# Patient Record
Sex: Female | Born: 1960 | Race: White | Hispanic: No | Smoking: Former smoker
Health system: Southern US, Community
[De-identification: ages and names within clinical notes are randomized; demographics above are authoritative.]

## PROBLEM LIST (undated history)

## (undated) DIAGNOSIS — K219 Gastro-esophageal reflux disease without esophagitis: Secondary | ICD-10-CM

## (undated) DIAGNOSIS — F32A Depression, unspecified: Secondary | ICD-10-CM

## (undated) HISTORY — PX: CHOLECYSTECTOMY: SHX55

---

## 1998-03-05 ENCOUNTER — Other Ambulatory Visit: Admission: RE | Admit: 1998-03-05 | Discharge: 1998-03-05 | Payer: Self-pay | Admitting: Obstetrics and Gynecology

## 1999-01-31 ENCOUNTER — Ambulatory Visit (HOSPITAL_COMMUNITY): Admission: AD | Admit: 1999-01-31 | Discharge: 1999-01-31 | Payer: Self-pay | Admitting: Obstetrics and Gynecology

## 1999-01-31 ENCOUNTER — Encounter (INDEPENDENT_AMBULATORY_CARE_PROVIDER_SITE_OTHER): Payer: Self-pay | Admitting: *Deleted

## 1999-01-31 ENCOUNTER — Other Ambulatory Visit: Admission: RE | Admit: 1999-01-31 | Discharge: 1999-01-31 | Payer: Self-pay | Admitting: Obstetrics and Gynecology

## 2000-03-18 ENCOUNTER — Encounter: Admission: RE | Admit: 2000-03-18 | Discharge: 2000-03-18 | Payer: Self-pay | Admitting: Obstetrics and Gynecology

## 2000-03-18 ENCOUNTER — Encounter: Payer: Self-pay | Admitting: Obstetrics and Gynecology

## 2001-05-11 ENCOUNTER — Other Ambulatory Visit: Admission: RE | Admit: 2001-05-11 | Discharge: 2001-05-11 | Payer: Self-pay | Admitting: *Deleted

## 2002-09-22 ENCOUNTER — Emergency Department (HOSPITAL_COMMUNITY): Admission: EM | Admit: 2002-09-22 | Discharge: 2002-09-22 | Payer: Self-pay | Admitting: Emergency Medicine

## 2003-12-27 ENCOUNTER — Encounter: Admission: RE | Admit: 2003-12-27 | Discharge: 2003-12-27 | Payer: Self-pay | Admitting: Family Medicine

## 2004-11-18 ENCOUNTER — Emergency Department (HOSPITAL_COMMUNITY): Admission: EM | Admit: 2004-11-18 | Discharge: 2004-11-18 | Payer: Self-pay | Admitting: Emergency Medicine

## 2006-01-30 ENCOUNTER — Other Ambulatory Visit: Admission: RE | Admit: 2006-01-30 | Discharge: 2006-01-30 | Payer: Self-pay | Admitting: Family Medicine

## 2006-02-02 ENCOUNTER — Encounter: Admission: RE | Admit: 2006-02-02 | Discharge: 2006-02-02 | Payer: Self-pay | Admitting: Family Medicine

## 2006-10-14 ENCOUNTER — Encounter: Admission: RE | Admit: 2006-10-14 | Discharge: 2006-10-14 | Payer: Self-pay | Admitting: Gastroenterology

## 2007-02-04 ENCOUNTER — Other Ambulatory Visit: Admission: RE | Admit: 2007-02-04 | Discharge: 2007-02-04 | Payer: Self-pay | Admitting: Family Medicine

## 2007-08-07 ENCOUNTER — Emergency Department (HOSPITAL_COMMUNITY): Admission: EM | Admit: 2007-08-07 | Discharge: 2007-08-07 | Payer: Self-pay | Admitting: Emergency Medicine

## 2007-10-09 ENCOUNTER — Emergency Department (HOSPITAL_COMMUNITY): Admission: EM | Admit: 2007-10-09 | Discharge: 2007-10-09 | Payer: Self-pay | Admitting: Emergency Medicine

## 2007-10-14 ENCOUNTER — Encounter: Admission: RE | Admit: 2007-10-14 | Discharge: 2007-10-14 | Payer: Self-pay | Admitting: Gastroenterology

## 2007-10-15 ENCOUNTER — Inpatient Hospital Stay (HOSPITAL_COMMUNITY): Admission: AD | Admit: 2007-10-15 | Discharge: 2007-10-20 | Payer: Self-pay | Admitting: Gastroenterology

## 2007-10-27 ENCOUNTER — Encounter: Admission: RE | Admit: 2007-10-27 | Discharge: 2007-10-27 | Payer: Self-pay | Admitting: Diagnostic Radiology

## 2008-06-19 IMAGING — CR DG ABDOMEN ACUTE W/ 1V CHEST
3 series · 3 of 3 positions shown · non-contrast
Comparison: None

CLINICAL DATA: Nausea vomiting and diarrhea low abdominal pain

ACUTE ABDOMEN SERIES (ABDOMEN 2 VIEW & CHEST 1 VIEW)

[w chest pa *]
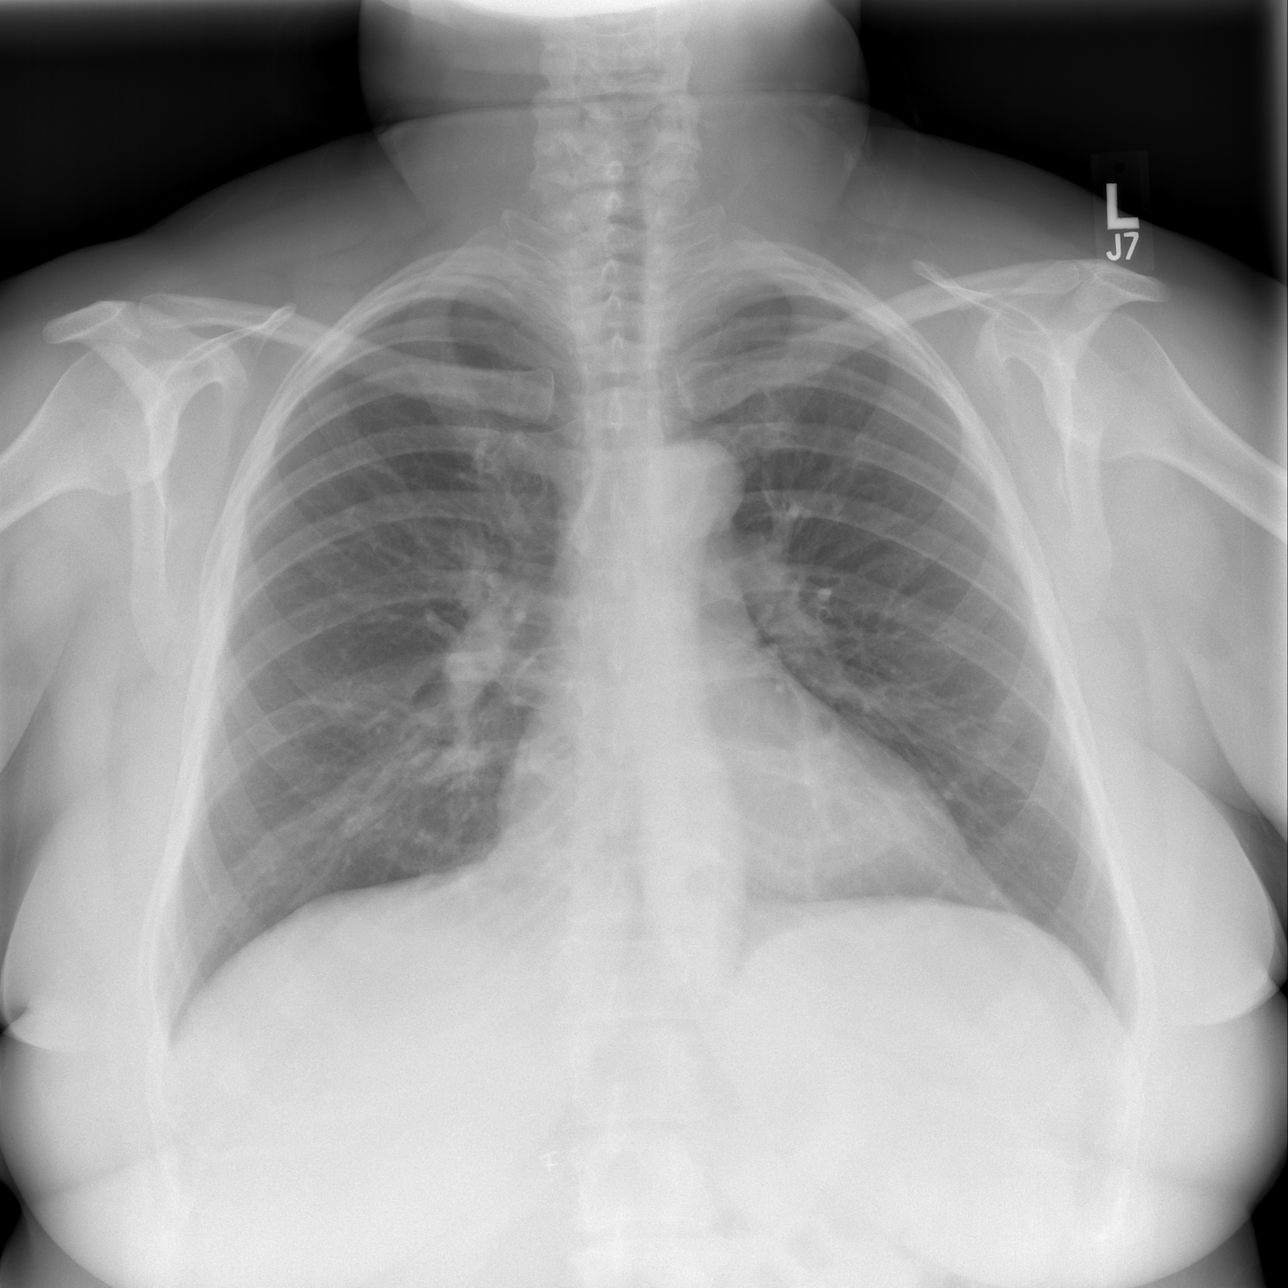

[w abdomen upright]
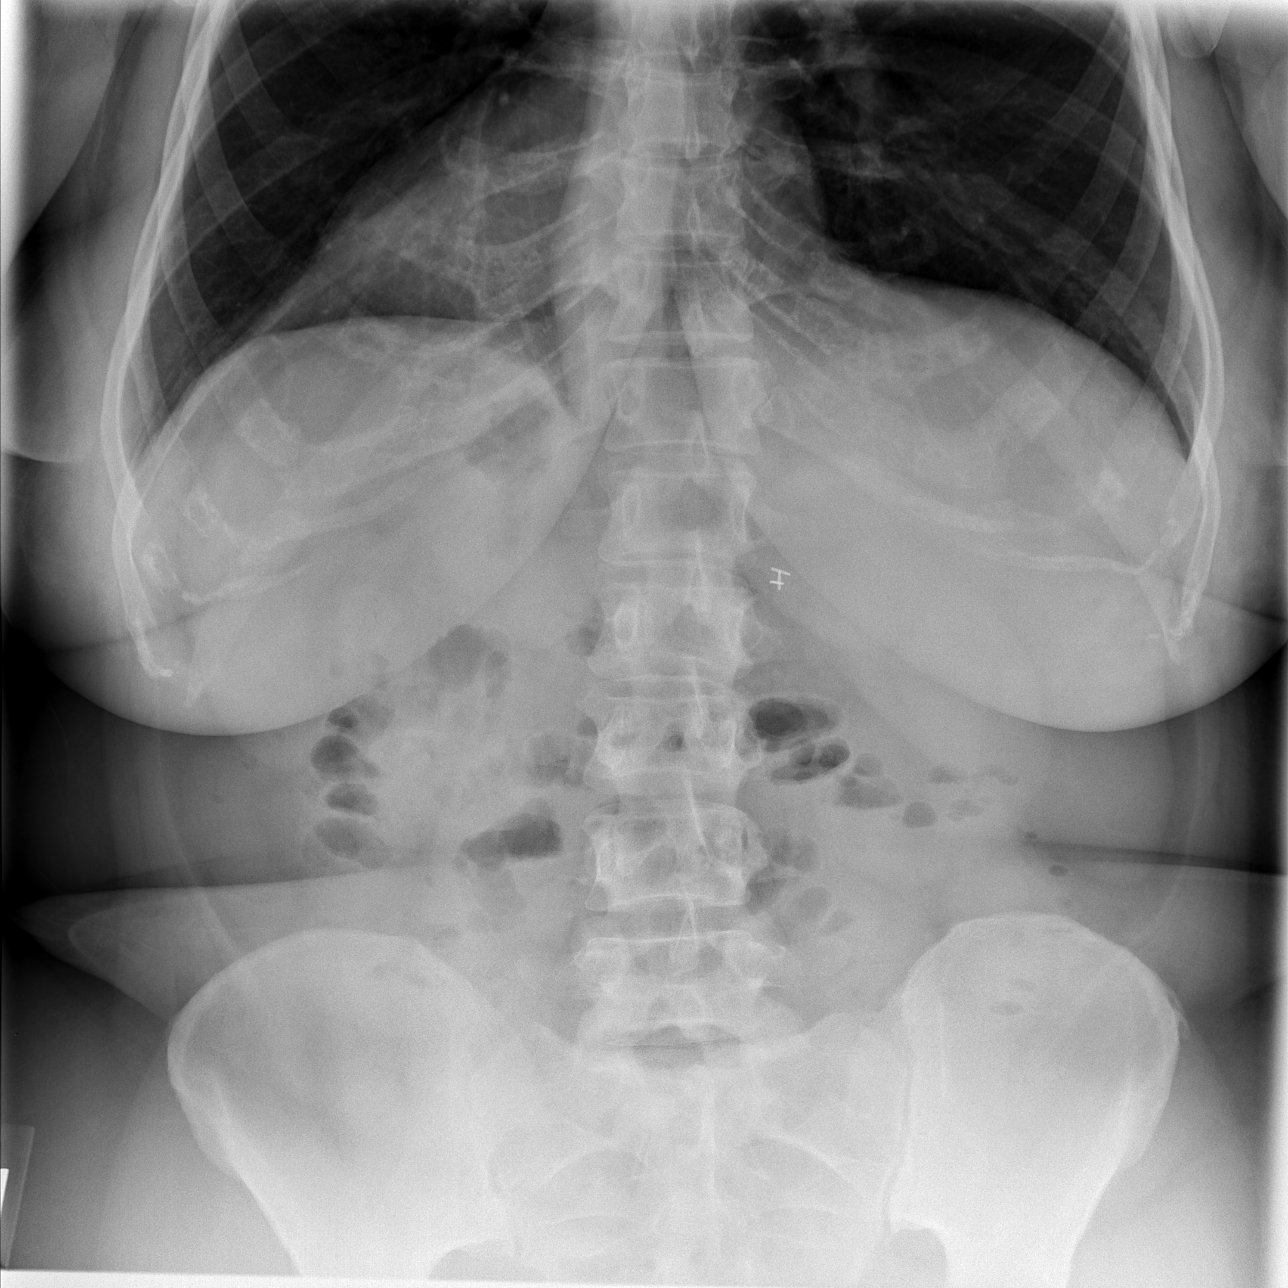

[t abdomen supine]
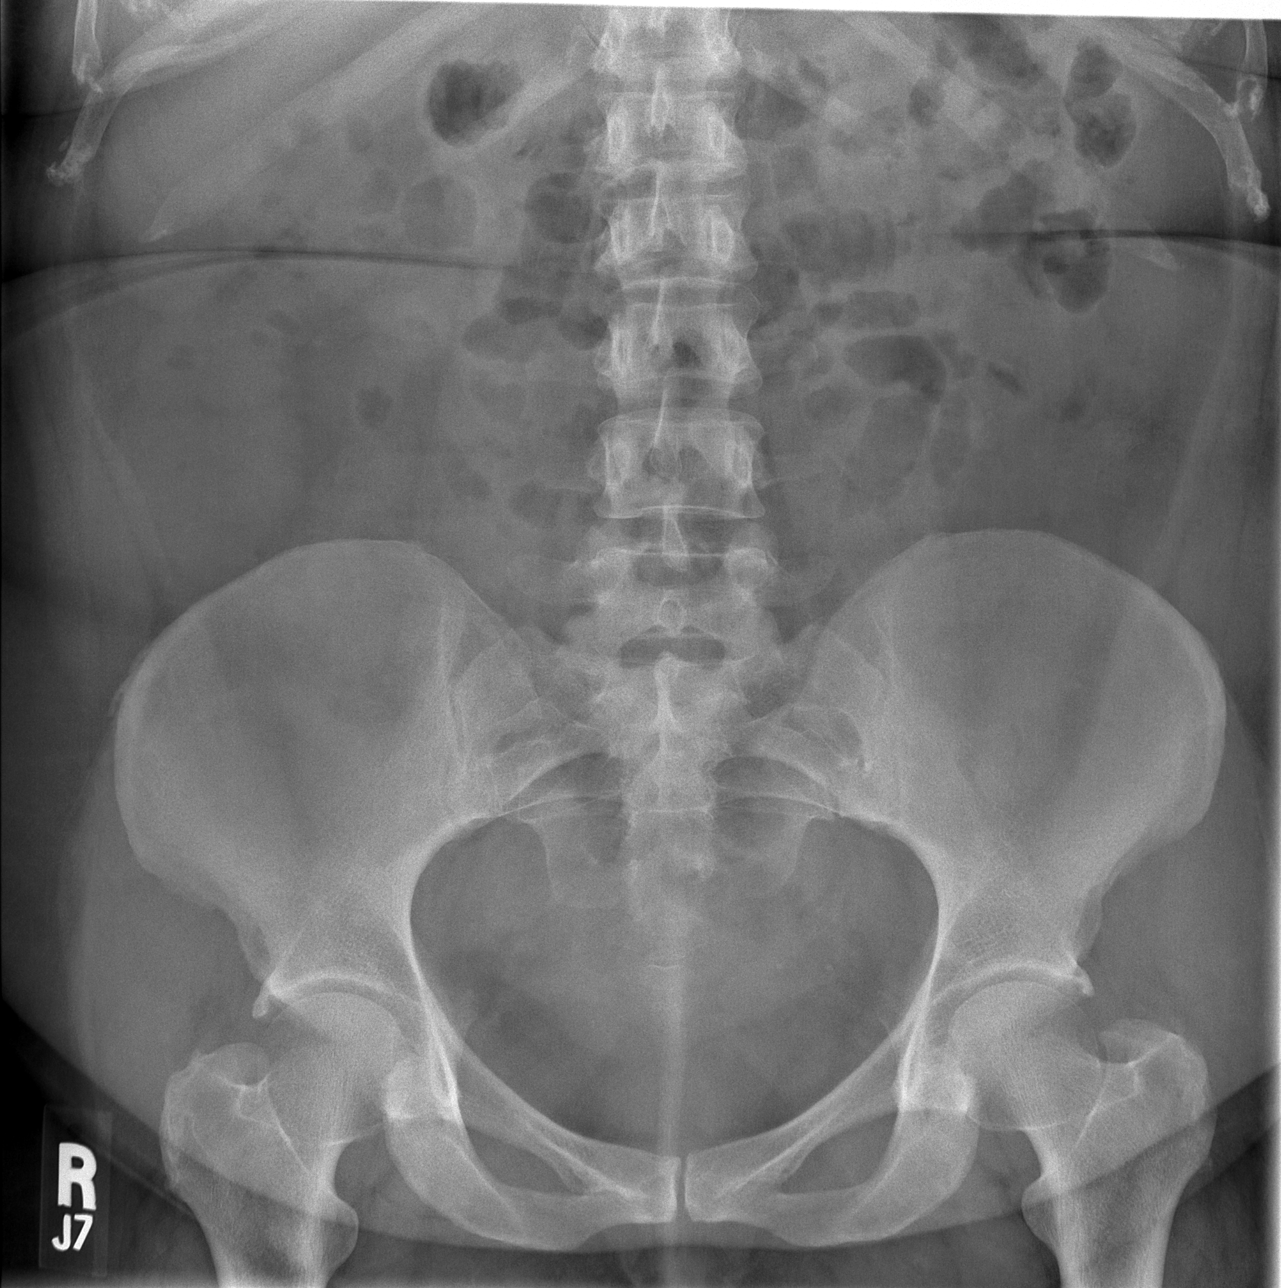

[3 of 3 positions shown; findings below may reference images not displayed]

FINDINGS: The lungs are clear without focal consolidation, edema,
effusion or pneumothorax.  Cardiopericardial silhouette is within
normal limits for size.  Imaged bony structures of the thorax are
intact.

Upright film shows no intraperitoneal free air.  The bowel gas
pattern is nonspecific.  Surgical clips in the right upper quadrant
suggest prior cholecystectomy.  Visualized bony structures are
unremarkable.
IMPRESSION: No acute cardiopulmonary process.

Nonspecific bowel gas pattern.

## 2009-04-13 ENCOUNTER — Other Ambulatory Visit: Admission: RE | Admit: 2009-04-13 | Discharge: 2009-04-13 | Payer: Self-pay | Admitting: Family Medicine

## 2010-08-12 ENCOUNTER — Other Ambulatory Visit (HOSPITAL_COMMUNITY)
Admission: RE | Admit: 2010-08-12 | Discharge: 2010-08-12 | Disposition: A | Discharge status: Home / Self Care | Payer: BC Managed Care – PPO | Source: Ambulatory Visit | Attending: Family Medicine | Admitting: Family Medicine

## 2010-08-12 ENCOUNTER — Other Ambulatory Visit: Payer: Self-pay | Admitting: Family Medicine

## 2010-08-12 DIAGNOSIS — Z124 Encounter for screening for malignant neoplasm of cervix: Secondary | ICD-10-CM | POA: Insufficient documentation

## 2010-09-10 NOTE — Discharge Summary (Signed)
NAME:  Margaret Hansen, Margaret Hansen               ACCOUNT NO.:  [id number]   MEDICAL RECORD NO.:  [id number]          PATIENT TYPE:  INP   LOCATION:  5121                         FACILITY:  [facility]   PHYSICIAN:  John C. Madilyn Hansen, M.D.    DATE OF BIRTH:  1960-06-18   DATE OF ADMISSION:  10/15/2007  DATE OF DISCHARGE:  10/20/2007                               DISCHARGE SUMMARY   DISCHARGE DIAGNOSES:  Complicated rectosigmoid diverticulitis with  diverticular abscess.   PAST MEDICAL HISTORY:  Significant for obesity and depression.   CONSULTS:  [facility] on October 18, 2007, Dr. Gabrielle Dare.  [name], MD.   PROCEDURES:  Interventional radiology who performed a CT-guided left  lower quadrant diverticular abscess drainage and drain placement on October 18, 2007.  This was done by Dr. Ruel Favors.   HISTORY:  This is a 50 year old female with a history of probable  diverticulitis in 2004.  She was admitted after worsening of sigmoid  diverticulitis was noticed on CT scan done October 14, 2007.  This is after  she had taken 6 days of oral Cipro and Flagyl.  Her CT scan demonstrated  a new abscess that was approximately 3 x 7 cm.  The patient looked well.  She denies severe pain.  She reported fevers of 100-101 degrees.  She  did not want to come into the hospital, but was convinced that she  should be admitted for IV antibiotics and further workup.   HOSPITAL COURSE:  She was admitted to Dr. Beverlee Nims service and  treated with IV cefoxitin as well as IV Flagyl.  Her CT was repeated on  October 18, 2007, she had an increase in the size of her abscess as well as  several new smaller fluid collections identified posterior to the  uterus, in the area of her colon.  Her diverticulitis was not improved.  [facility] was consulted.  They recommended interventional  radiology be called and the abscess be drained.  They also recommended  an elective hemicolectomy in 6-8 weeks after her  diverticulitis cools  down.  On October 19, 2007, the interventional radiology procedure was done  without complications.  Dr. Ruel Favors was able to drain the abscess  and then insert a 10-French drain.   PHYSICAL EXAMINATION:  The patient was alert and oriented.  She looked  well.  She was up and walking about the room.  She denied any pain.  Her  drain was placed.  The site looked clean and dry, was bandaged without  any leakage.  She had approximately 10 mL of pink fluid in her drain.  She was not tender.  She denied any further fevers or headaches.   LABORATORY DATA:  White count was 10.7, hemoglobin 11.5, hematocrit  34.8, and platelets 352.  BMET was within normal limits.  Culture  abscess, preliminary returned today and reports gram-positive cocci in  pairs, moderate wbc present, both PMN and mononuclear.   The patient was discharged to home in good condition.  Home Health was  ordered for her to help with drain care  and daily flushes.  She has  followup appointments with [facility] in 2-3 weeks.  She  has an appointment with Dr. Herbert Moors on November 12, 2007.   DISCHARGE MEDICATIONS:  1. Flagyl 500 mg p.o. b.i.d.  2. Augmentin 875 mg p.o. b.i.d.  3. Prozac 40 mg daily.  4. MiraLax 17 g p.o. daily.   She was also given prescriptions for tramadol 50 mg 1 tablet p.o. every  6 hours p.r.n. pain and Phenergan 25 mg 1 tablet p.o. every 12 hours  p.r.n. pain.   She was instructed to call the [facility] GI office with any fevers, increase  in pain, nausea, or vomiting.  She was also given a note to return to  work on Monday, October 25, 2007, barring any fever, increased pain, or  vomiting.      Stephani Police, PA    ______________________________  Margaret Hansen, M.D.    MLY/MEDQ  D:  10/20/2007  T:  10/21/2007  Job:  [id number]   cc:   Graylin Shiver, M.D.  Quita Skye Guderian Flock, M.D.

## 2010-09-10 NOTE — H&P (Signed)
NAME:  Margaret Hansen, Margaret Hansen NO.:  [id number]   MEDICAL RECORD NO.:  [id number]          PATIENT TYPE:  INP   LOCATION:  5121                         FACILITY:  [facility]   PHYSICIAN:  Graylin Shiver, M.D.   DATE OF BIRTH:  1960-08-20   DATE OF ADMISSION:  10/15/2007  DATE OF DISCHARGE:                              HISTORY & PHYSICAL   CHIEF COMPLAINT:  Abdominal pain, diverticulitis.   HISTORY OF PRESENT ILLNESS:  The patient is a 50 year old white female  who went to the emergency room 6 days ago with abdominal pain.  A CT  scan showed findings consistent with a mild sigmoid diverticulitis.  She  was sent home on Cipro t.i.d. and Flagyl t.i.d.  Two days later, she saw  Dr. Dorena Cookey in the office and a followup CT scan was done yesterday,  which showed worsening of the diverticulitis and the development of a  abscess.  It was felt that the patient needed admission for IV  antibiotics.  When we called the patient today, she was at work.  She  did not really want to come into the hospital, but we told her that she  should be here for this.  She still has some lower abdominal pain, but  it is not that bad.  She has been running of fever at home anywhere from  100-101 at times.  She has continued to take the antibiotics, but  because of the development of the abscess, it was felt important that  she be in the hospital for IV antibiotics and closer followup   ALLERGIES:  None known.   MEDICATIONS:  Prozac 40 mg daily.   MEDICAL PROBLEMS:  Depression.   SURGERIES:  Cholecystectomy, two C-sections.   SOCIAL HISTORY:  Does not smoke, rarely drinks alcohol.   REVIEW OF SYSTEMS:  No complaints of anginal chest pain, shortness of  breath, cough, or sputum production.   PHYSICAL EXAMINATION:  VITAL SIGNS:  Temperature 98.2, blood pressure  103/65, and pulse 79.  GENERAL:  She is in no distress, alert, oriented, and nonicteric.  NECK:  Supple.  HEART:  Regular rhythm.  No  murmurs.  LUNGS:  Clear.  ABDOMEN:  Bowel sounds are normal.  It is soft.  There is some minimal  tenderness in the left lower quadrant, but no rebound or guarding.  EXTREMITIES:  Without edema.   IMPRESSION:  Diverticulitis with development of abscess despite taking  oral antibiotics as an outpatient.   PLAN:  The patient is being admitted to the hospital.  We will start her  on clear liquids to rest the gut.  IV antibiotics will be started.  I  will start Mefoxin 1 gram IV q.6 hours and Flagyl 500 mg IV q.12 hours.  We may need to have Interventional Radiology see the patient to drain  the abscess, this will be determined.           ______________________________  Graylin Shiver, M.D.     SFG/MEDQ  D:  10/15/2007  T:  10/16/2007  Job:  [id number]   cc:  John C. Madilyn Fireman, M.D.  Bernette Redbird, M.D.  Quita Skye Onley Flock, M.D.

## 2010-09-10 NOTE — Consult Note (Signed)
NAME:  Margaret Hansen, Margaret Hansen               ACCOUNT NO.:  [id number]   MEDICAL RECORD NO.:  [id number]          PATIENT TYPE:  INP   LOCATION:  5121                         FACILITY:  [facility]   PHYSICIAN:  Gabrielle Dare. [name], M.D.DATE OF BIRTH:  08-26-1960   DATE OF CONSULTATION:  10/18/2007  DATE OF DISCHARGE:  08/07/2007                                 CONSULTATION   TIME OF CONSULTATION:  1310.   PRIMARY CARE PHYSICIAN:  Quita Skye. Kindl, MD   REASON FOR CONSULTATION:  Diverticular disease with evolving abscess.   HISTORY OF PRESENT ILLNESS:  Margaret Hansen is a 50 year old female patient,  otherwise healthy.  She is obese who had known mild sigmoid diverticular  disease 6 days prior to admission.  She was treated as an outpatient  with Cipro and Flagyl orally.  A repeat CT done per Dr. Madilyn Fireman with GI 2  days prior to admission demonstrated increasing diverticular process  with an evolving abscess.  The patient was subsequently admitted on October 15, 2007, for bowel rest and IV antibiotics.  She has subsequently been  advanced to full liquid diet as of today.  Repeat CT today does show an  increase in size of the rectosigmoid abscess, which is better organized  and now measures 7 x 3.3 cm in size.  Surgical consultation has been  requested.   REVIEW OF SYSTEMS:  As per the history of present illness.  GI:  The  patient has had no significant pain even with the initial onset of her  symptoms and currently denies pain.  She is tolerating clear liquid diet  and now is advancing to full.  Otherwise, review of systems is  noncontributory.   FAMILY MEDICAL HISTORY:  Noncontributory.   SOCIAL HISTORY:  No tobacco.  Rare social alcohol.  No illegal drugs.   PAST MEDICAL HISTORY:  Depression.   PAST SURGICAL HISTORY:  1. C-section x2.  2. Cholecystectomy, open.   ALLERGIES:  NKDA.   CURRENT MEDICATIONS:  Prozac, Protonix, IV fluids, Mefoxin IV, Flagyl  IV, p.r.n. Tylenol, and various other p.r.n.  medications for pain and  discomfort.   PHYSICAL EXAMINATION:  CONSTITUTIONAL/GENERAL:  Pleasant female patient  without any specific complaints.  VITAL SIGNS:  Temperature 98.8, BP 99/52, pulse 60 and regular, and  respirations 16.  EYES:  Sclerae are noninjected.  Conjunctivae pink.  Pupils are equal  and reactive to light.  EARS, NOSE, MOUTH, AND THROAT:  Ears are symmetric in appearance without  any inappropriate lesions.  Nose is midline without rhinorrhea.  Mouth:  Oral mucous membranes are pink and moist.  NECK:  Trachea is midline.  Thyroid is down and nonpalpable.  LUNGS:  Respiratory effort is normal, nonlabored.  Bilateral lung sounds  are clear to auscultation.  She is on room air.  CARDIOVASCULAR:  Heart sounds are S1 and S2 without obvious rubs,  murmurs or gallops.  No JVD.  No peripheral edema.  Carotid, radial, and pedal pulses are 1+ bilaterally.  GI:  Abdomen is obese but soft and nontender.  Bowel sounds are present  and normoactive.  No obvious hepatosplenomegaly, masses, or bruits.  No  abdominal hernias located.  She has an oblique scar in the right upper  quadrant region of the abdomen.  MUSCULOSKELETAL:  Symmetrical in appearance without clubbing or  cyanosis.  SKIN:  Normal in appearance without untoward rashes, lesions, masses,  scar as described on the abdomen.  NEURO:  Cranial nerves II through XII are grossly intact.  Sensation is  grossly intact in the upper and lower extremities bilaterally.  DTRs and  gait imbalance were not assessed.  PSYCH:  The patient is oriented to time, person, place, and situation.  Her affect is appropriate to current situation.   LABORATORY DATA:  White count 10,300.  On October 09, 2007, her white count  was 20,600 and on June, 19, 2009, it was 12,400, hemoglobin 11.8, and  platelets 266,000.  Sodium 141, potassium 3.6, CO2 of 26, glucose 78,  BUN 13, and creatinine 0.66.   DIAGNOSTIC:  CT of the abdomen and pelvis as  noted.   IMPRESSION:  1. Acute rectosigmoid diverticulitis, improving.  2. Sigmoid diverticular abscess, more organized.  3. Leukocytosis, improving.   PLAN:  1. We will request for interventional radiology to evaluate for      percutaneous drainage of abscess.  CT also shows some small      abscesses behind the uterus and bladder region that are probably      connected to this larger abscess.  Drainage of this abscess will      help [place] narrow antibiotic therapy and make sure there is no      associated fistulous tract.  2. We will continue current antibiotic therapy.  I will switch over to      oral medication and continue these for at least 1-2 weeks after      discharge.  3. The patient will need to undergo elective segmental colectomy in      about 6-8 weeks after the initial inflammatory process from the      diverticulitis has cooled down.  I have discussed this already with      the patient.      Allison L. Rennis Harding, N.P.      Gabrielle Dare [name], M.D.  Electronically Signed    ALE/MEDQ  D:  10/18/2007  T:  10/19/2007  Job:  [id number]   cc:   Quita Skye. Mitchum Flock, M.D.

## 2010-09-13 NOTE — Consult Note (Signed)
NAME:  Margaret Hansen, Margaret Hansen                         ACCOUNT NO.:  [id number]   MEDICAL RECORD NO.:  [id number]                   PATIENT TYPE:  EMS   LOCATION:  ED                                   FACILITY:  [facility]   PHYSICIAN:  Sandria Bales. Ezzard Standing, M.D.               DATE OF BIRTH:  Jan 17, 1961   DATE OF CONSULTATION:  09/22/2002  DATE OF DISCHARGE:                                   CONSULTATION   REFERRING PHYSICIAN:  Dr. Vale Haven. Wilson.   HISTORY OF ILLNESS:  The patient is a 50 year old white female, who around  noon yesterday started having lower abdominal pain.  She really could not  tell where it localized.  She had a temperature of 102 last night, had a lot  of pain, she tried some Tylenol for her fevers and Naprosyn for her pain,  presented to the office of Dr. Karma Ganja today and he was concerned about  her lower abdominal pain and wanted me to see her in the emergency room.   The patient, herself, has had no prior history of peptic ulcer disease,  liver disease, pancreatic disease or colon disease.  She had an open  cholecystectomy in 1986.  She has seen Dr. Bernette Redbird in April of 2002,  where she underwent a flexible sigmoidoscopy, which was negative;  apparently, she had some rectal bleeding which prompted that examination.  She has had no other abdominal surgery, except she did have two  C-sections  for her two sons, who are now 46 and 62 years of age.   ALLERGIES:  She has no known allergies.   MEDICATIONS:  Her only medication she is on is Prozac.   REVIEW OF SYSTEMS:  NEUROLOGIC:  No history of seizure or loss of  consciousness.  PULMONARY:  No history of pneumonia or tuberculosis.  CARDIAC:  No history of heart disease, chest pain or hypertension.  GASTROINTESTINAL:  See history of present illness.  UROLOGIC:  No history of  kidney stones or kidney infections.  GYN:  She is a gravida 2, para 2;  again, she has had two children by C-sections.  She had her last  period  about three weeks ago.  She has had no irregularity in her periods so far.   PHYSICAL EXAMINATION:  VITAL SIGNS:  On physical exam, her temperature is  98.1, pulse 84, respirations 18, blood pressure 105/71.  GENERAL:  She is a well-nourished, obese white female, alert and cooperative  on physical exam.  HEENT:  Unremarkable.  NECK:  Her neck is supple without thyroid mass or nodularity.  HEART:  Her heart has a regular rate and rhythm without murmur or rub.  LUNGS:  Her lungs are clear to auscultation.  ABDOMEN:  Her abdomen shows decreased bowel sounds.  She is tender in her  left lower quadrant, though I feel no rebound or guarding, though she does  have some tenderness in the left lower quadrant.  She has a well-healed  right subcostal scar.  She has no evidence of abdominal hernia and no  abdominal wall mass.  PELVIC:  Bimanual pelvic exam in which I could move her uterus, it is in  normal location without any pain.  I did not feel any adnexal masses.  She  is more tender on her left adnexa.  RECTAL:  Her rectal exam reveals guaiac-negative stool without palpable  rectal mass.  EXTREMITIES:  She has good strength in all four extremities.  NEUROLOGIC:  Neurologically is grossly intact.   LABORATORY DATA:  Labs that I have at the time of this dictation include a  white blood count of 14,000, a hemoglobin of 12, hematocrit 35, a urinalysis  which is normal.  Her complete metabolic panel is pending.   IMPRESSION:  1. My impression is that the patient has probable diverticular disease from     some mild-to-moderate diverticulitis; however, with her elevated white     blood count and temperature of 102 last night, I feel she would be best     served by having a CT scan of her abdomen, and I guess another thing she     could have would be just a gastroenteritis; she could also more remotely     have appendicitis or some kind of ovarian disease.   If her CT scan confirms  diverticulitis or the absence of any inflammatory  changes, I would probably put her on oral antibiotics for a period of one to  two weeks.  If obviously she has another disease process, will pursue that.   1. Morbid obesity.   1. On Prozac.                                               Sandria Bales. Ezzard Standing, M.D.    DHN/MEDQ  D:  09/22/2002  T:  09/22/2002  Job:  [id number]   cc:   Vale Haven. Andrey Campanile, M.D.  [address]  [place]  [place] [address]  Fax: [phone]   Chester Holstein. Earlene Plater, M.D.  [address]  [place]  [place] [address]  Fax: [phone]

## 2011-01-21 LAB — URINALYSIS, ROUTINE W REFLEX MICROSCOPIC
Hgb urine dipstick: NEGATIVE
Nitrite: NEGATIVE
Specific Gravity, Urine: 1.028
Urobilinogen, UA: 0.2

## 2011-01-21 LAB — COMPREHENSIVE METABOLIC PANEL
ALT: 13
AST: 17
Albumin: 3.3 — ABNORMAL LOW
Alkaline Phosphatase: 68
Chloride: 106
GFR calc Af Amer: >60
Potassium: 4
Sodium: 139
Total Bilirubin: 0.7

## 2011-01-21 LAB — CBC
HCT: 38.8
Platelets: 247
WBC: 10.4

## 2011-01-21 LAB — PREGNANCY, URINE: Preg Test, Ur: NEGATIVE

## 2011-01-23 LAB — CBC
HCT: 34.8 — ABNORMAL LOW
Hemoglobin: 11.3 — ABNORMAL LOW
Hemoglobin: 11.8 — ABNORMAL LOW
Hemoglobin: 12.6
MCHC: 33.3
MCHC: 33.3
MCHC: 33.4
MCHC: 34.1
MCV: 84.2
MCV: 83.5
MCV: 83.7
MCV: 84
Platelets: 280
Platelets: 287
Platelets: 352
RBC: 4.2
RBC: 4.3
RBC: 4.41
RDW: 16.1 — ABNORMAL HIGH
RDW: 15.3
RDW: 15.5
WBC: 10.4
WBC: 10.7 — ABNORMAL HIGH
WBC: 12.4 — ABNORMAL HIGH

## 2011-01-23 LAB — COMPREHENSIVE METABOLIC PANEL
ALT: 19
ALT: 18
AST: 18
AST: 22
Albumin: 2.5 — ABNORMAL LOW
Alkaline Phosphatase: 52
CO2: 26
Calcium: 7.9 — ABNORMAL LOW
Chloride: 105
Creatinine, Ser: 0.82
GFR calc Af Amer: >60
GFR calc Af Amer: >60
GFR calc non Af Amer: >60
Potassium: 3.6
Sodium: 137
Sodium: 141
Total Bilirubin: 0.5
Total Protein: 5.5 — ABNORMAL LOW

## 2011-01-23 LAB — BASIC METABOLIC PANEL
BUN: 5 — ABNORMAL LOW
Chloride: 107
Creatinine, Ser: 0.78
Glucose, Bld: 121 — ABNORMAL HIGH

## 2011-01-23 LAB — APTT: aPTT: 34

## 2011-01-23 LAB — CULTURE, ROUTINE-ABSCESS

## 2011-01-23 LAB — URINALYSIS, ROUTINE W REFLEX MICROSCOPIC
Hgb urine dipstick: NEGATIVE
Nitrite: NEGATIVE
Protein, ur: NEGATIVE
Specific Gravity, Urine: 1.025
Urobilinogen, UA: 1

## 2011-01-23 LAB — DIFFERENTIAL
Eosinophils Absolute: 0
Lymphocytes Relative: 15
Lymphs Abs: 3.2
Monocytes Relative: 2 — ABNORMAL LOW
Neutrophils Relative %: 83 — ABNORMAL HIGH

## 2011-01-23 LAB — POCT PREGNANCY, URINE: Preg Test, Ur: NEGATIVE

## 2011-01-23 LAB — LIPASE, BLOOD: Lipase: 37

## 2012-07-09 ENCOUNTER — Other Ambulatory Visit (HOSPITAL_COMMUNITY)
Admission: RE | Admit: 2012-07-09 | Discharge: 2012-07-09 | Disposition: A | Discharge status: Home / Self Care | Payer: BC Managed Care – PPO | Source: Ambulatory Visit | Attending: Family Medicine | Admitting: Family Medicine

## 2012-07-09 ENCOUNTER — Other Ambulatory Visit: Payer: Self-pay | Admitting: Family Medicine

## 2012-07-09 DIAGNOSIS — Z124 Encounter for screening for malignant neoplasm of cervix: Secondary | ICD-10-CM | POA: Insufficient documentation

## 2014-05-29 ENCOUNTER — Ambulatory Visit: Payer: Self-pay

## 2014-05-29 ENCOUNTER — Other Ambulatory Visit: Payer: Self-pay | Admitting: Occupational Medicine

## 2014-05-29 DIAGNOSIS — Z0000 Encounter for general adult medical examination without abnormal findings: Secondary | ICD-10-CM

## 2016-03-11 ENCOUNTER — Ambulatory Visit: Payer: Self-pay | Admitting: Sports Medicine

## 2016-03-24 ENCOUNTER — Encounter: Payer: Self-pay | Admitting: Podiatry

## 2016-03-24 ENCOUNTER — Ambulatory Visit (INDEPENDENT_AMBULATORY_CARE_PROVIDER_SITE_OTHER): Payer: BLUE CROSS/BLUE SHIELD | Admitting: Podiatry

## 2016-03-24 ENCOUNTER — Ambulatory Visit (INDEPENDENT_AMBULATORY_CARE_PROVIDER_SITE_OTHER): Payer: BLUE CROSS/BLUE SHIELD

## 2016-03-24 VITALS — BP 113/70 | HR 73 | Resp 16 | Ht 60.0 in | Wt 225.0 lb

## 2016-03-24 DIAGNOSIS — M79672 Pain in left foot: Secondary | ICD-10-CM | POA: Diagnosis not present

## 2016-03-24 DIAGNOSIS — M79671 Pain in right foot: Secondary | ICD-10-CM

## 2016-03-24 DIAGNOSIS — M779 Enthesopathy, unspecified: Secondary | ICD-10-CM | POA: Diagnosis not present

## 2016-03-24 NOTE — Progress Notes (Signed)
   Subjective:    Patient ID: Margaret Hansen, female    DOB: 03/15/1961, 55 y.o.   MRN: [id number]  HPI Chief Complaint  Patient presents with  . Foot Pain    Bilateral; arch & dorsal; pt stated, "Sometimes feels like someone is standing on feet; Feels popping noise in feet sometimes"; x1 yr      Review of Systems  All other systems reviewed and are negative.      Objective:   Physical Exam        Assessment & Plan:

## 2016-03-24 NOTE — Progress Notes (Signed)
Subjective:     Patient ID: Margaret Hansen, female   DOB: 02/27/1961, 55 y.o.   MRN: [id number]  HPI patient states she's developed a lot of pain in the arch of both feet and I've had a history of plantar fasciitis and I also getting some occasional pain in my big toe joint right   Review of Systems  All other systems reviewed and are negative.      Objective:   Physical Exam  Constitutional: She is oriented to person, place, and time.  Cardiovascular: Intact distal pulses.   Musculoskeletal: Normal range of motion.  Neurological: She is oriented to person, place, and time.  Skin: Skin is warm.  Nursing note and vitals reviewed.  neurovascular status intact muscle strength adequate range of motion within normal limits with patient found to have moderate discomfort in the arch of both feet with depression of the arch noted and tendinitis-like symptoms of a mild to moderate nature. I also noted reduced range of motion first MPJ right with spurring of the dorsal first metatarsal head with elevation of the segment noted. 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 May require more aggressive treatment depending on response  X-ray report indicates there is some spurring dorsal first metatarsal head right with depression of the arch bilateral

## 2016-04-01 ENCOUNTER — Other Ambulatory Visit: Payer: Self-pay | Admitting: Family Medicine

## 2016-04-01 ENCOUNTER — Other Ambulatory Visit (HOSPITAL_COMMUNITY)
Admission: RE | Admit: 2016-04-01 | Discharge: 2016-04-01 | Disposition: A | Discharge status: Home / Self Care | Payer: Self-pay | Source: Ambulatory Visit | Attending: Family Medicine | Admitting: Family Medicine

## 2016-04-01 DIAGNOSIS — Z124 Encounter for screening for malignant neoplasm of cervix: Secondary | ICD-10-CM | POA: Insufficient documentation

## 2016-04-03 LAB — CYTOLOGY - PAP: DIAGNOSIS: NEGATIVE

## 2016-04-14 ENCOUNTER — Ambulatory Visit (INDEPENDENT_AMBULATORY_CARE_PROVIDER_SITE_OTHER): Payer: BLUE CROSS/BLUE SHIELD | Admitting: Podiatry

## 2016-04-14 DIAGNOSIS — M779 Enthesopathy, unspecified: Secondary | ICD-10-CM

## 2016-04-14 NOTE — Patient Instructions (Addendum)
WEARING INSTRUCTIONS FOR ORTHOTICS  Don't expect to be comfortable wearing your orthotic devices for the first time.  Like eyeglasses, you may be aware of them as time passes, they will not be uncomfortable and you will enjoy wearing them.  FOLLOW THESE INSTRUCTIONS EXACTLY!  1. Wear your orthotic devices for:       Not more than 1 hour the first day.       Not more than 2 hours the second day.       Not more than 3 hours the third day and so on.        Or wear them for as long as they feel comfortable.       If you experience discomfort in your feet or legs take them out.  When feet & legs feel       better, put them back in.  You do need to be consistent and wear them a little        everyday. 2.   If at any time the orthotic devices become acutely uncomfortable before the       time for that particular day, STOP WEARING THEM. 3.   On the next day, do not increase the wearing time. 4.   Subsequently, increase the wearing time by 15-30 minutes only if comfortable to do       so. 5.   You will be seen by your doctor about 2-4 weeks after you receive your orthotic       devices, at which time you will probably be wearing your devices comfortably        for about 8 hours or more a day. 6.   Some patients occasionally report mild aches or discomfort in other parts of the of       body such as the knees, hips or back after 3 or 4 consecutive hours of wear.  If this       is the case with you, do not extend your wearing time.  Instead, cut it back an hour or       two.  In all likelihood, these symptoms will disappear in a short period of time as your       body posture realigns itself and functions more efficiently. 7.   It is possible that your orthotic device may require some small changes or adjustment       to improve their function or make them more comfortable.   This is usually not done       before one to three months have elapsed.  These adjustments are made in        accordance  with the changed position your feet are assuming as a result of       improved biomechanical function. 8.   In women's shoes, it's not unusual for your heel to slip out of the shoe, particularly if       they are step-in-shoes.  If this is the case, try other shoes or other styles.  Try to       purchase shoes which have deeper heal seats or higher heel counters. 9.   Squeaking of orthotics devices in the shoes is due to the movement of the devices       when they are functioning normally.  To eliminate squeaking, simply dust some       baby powder into your shoes before inserting the devices.  If this does not work,          apply soap or wax to the edges of the orthotic devices or put a tissue into the shoes. 10. It is important that you follow these directions explicitly.  Failure to do so will simply       prolong the adjustment period or create problems which are easily avoided.  It makes       no difference if you are wearing your orthotic devices for only a few hours after        several months, so long as you are wearing them comfortably for those hours. 11. If you have any questions or complaints, contact our office.  We have no way of       knowing about your problems unless you tell us.  If we do not hear from you, we will       assume that you are proceeding well.   Achilles Tendinitis  with Rehab Achilles tendinitis is a disorder of the Achilles tendon. The Achilles tendon connects the large calf muscles (Gastrocnemius and Soleus) to the heel bone (calcaneus). This tendon is sometimes called the heel cord. It is important for pushing-off and standing on your toes and is important for walking, running, or jumping. Tendinitis is often caused by overuse and repetitive microtrauma. SYMPTOMS  Pain, tenderness, swelling, warmth, and redness may occur over the Achilles tendon even at rest.  Pain with pushing off, or flexing or extending the ankle.  Pain that is worsened after or during  activity. CAUSES   Overuse sometimes seen with rapid increase in exercise programs or in sports requiring running and jumping.  Poor physical conditioning (strength and flexibility or endurance).  Running sports, especially training running down hills.  Inadequate warm-up before practice or play or failure to stretch before participation.  Injury to the tendon. PREVENTION   Warm up and stretch before practice or competition.  Allow time for adequate rest and recovery between practices and competition.  Keep up conditioning.  Keep up ankle and leg flexibility.  Improve or keep muscle strength and endurance.  Improve cardiovascular fitness.  Use proper technique.  Use proper equipment (shoes, skates).  To help prevent recurrence, taping, protective strapping, or an adhesive bandage may be recommended for several weeks after healing is complete. PROGNOSIS   Recovery may take weeks to several months to heal.  Longer recovery is expected if symptoms have been prolonged.  Recovery is usually quicker if the inflammation is due to a direct blow as compared with overuse or sudden strain. RELATED COMPLICATIONS   Healing time will be prolonged if the condition is not correctly treated. The injury must be given plenty of time to heal.  Symptoms can reoccur if activity is resumed too soon.  Untreated, tendinitis may increase the risk of tendon rupture requiring additional time for recovery and possibly surgery. TREATMENT   The first treatment consists of rest anti-inflammatory medication, and ice to relieve the pain.  Stretching and strengthening exercises after resolution of pain will likely help reduce the risk of recurrence. Referral to a physical therapist or athletic trainer for further evaluation and treatment may be helpful.  A walking boot or cast may be recommended to rest the Achilles tendon. This can help break the cycle of inflammation and microtrauma.  Arch  supports (orthotics) may be prescribed or recommended by your caregiver as an adjunct to therapy and rest.  Surgery to remove the inflamed tendon lining or degenerated tendon tissue is rarely necessary and has shown less than predictable results. MEDICATION  Nonsteroidal anti-inflammatory medications, such as aspirin and ibuprofen, may be used for pain and inflammation relief. Do not take within 7 days before surgery. Take these as directed by your caregiver. Contact your caregiver immediately if any bleeding, stomach upset, or signs of allergic reaction occur. Other minor pain relievers, such as acetaminophen, may also be used.  Pain relievers may be prescribed as necessary by your caregiver. Do not take prescription pain medication for longer than 4 to 7 days. Use only as directed and only as much as you need.  Cortisone injections are rarely indicated. Cortisone injections may weaken tendons and predispose to rupture. It is better to give the condition more time to heal than to use them. HEAT AND COLD  Cold is used to relieve pain and reduce inflammation for acute and chronic Achilles tendinitis. Cold should be applied for 10 to 15 minutes every 2 to 3 hours for inflammation and pain and immediately after any activity that aggravates your symptoms. Use ice packs or an ice massage.  Heat may be used before performing stretching and strengthening activities prescribed by your caregiver. Use a heat pack or a warm soak. SEEK MEDICAL CARE IF:  Symptoms get worse or do not improve in 2 weeks despite treatment.  New, unexplained symptoms develop. Drugs used in treatment may produce side effects.  EXERCISES:  RANGE OF MOTION (ROM) AND STRETCHING EXERCISES - Achilles Tendinitis  These exercises may help you when beginning to rehabilitate your injury. Your symptoms may resolve with or without further involvement from your physician, physical therapist or athletic trainer. While completing these  exercises, remember:   Restoring tissue flexibility helps normal motion to return to the joints. This allows healthier, less painful movement and activity.  An effective stretch should be held for at least 30 seconds.  A stretch should never be painful. You should only feel a gentle lengthening or release in the stretched tissue.  STRETCH  Gastroc, Standing   Place hands on wall.  Extend right / left leg, keeping the front knee somewhat bent.  Slightly point your toes inward on your back foot.  Keeping your right / left heel on the floor and your knee straight, shift your weight toward the wall, not allowing your back to arch.  You should feel a gentle stretch in the right / left calf. Hold this position for 10 seconds. Repeat 3 times. Complete this stretch 2 times per day.  STRETCH  Soleus, Standing   Place hands on wall.  Extend right / left leg, keeping the other knee somewhat bent.  Slightly point your toes inward on your back foot.  Keep your right / left heel on the floor, bend your back knee, and slightly shift your weight over the back leg so that you feel a gentle stretch deep in your back calf.  Hold this position for 10 seconds. Repeat 3 times. Complete this stretch 2 times per day.  STRETCH  Gastrocsoleus, Standing  Note: This exercise can place a lot of stress on your foot and ankle. Please complete this exercise only if specifically instructed by your caregiver.   Place the ball of your right / left foot on a step, keeping your other foot firmly on the same step.  Hold on to the wall or a rail for balance.  Slowly lift your other foot, allowing your body weight to press your heel down over the edge of the step.  You should feel a stretch in your right / left calf.  Hold this position for 10 seconds.  Repeat this exercise with a slight bend in your knee. Repeat 3 times. Complete this stretch 2 times per day.   STRENGTHENING EXERCISES - Achilles  Tendinitis These exercises may help you when beginning to rehabilitate your injury. They may resolve your symptoms with or without further involvement from your physician, physical therapist or athletic trainer. While completing these exercises, remember:   Muscles can gain both the endurance and the strength needed for everyday activities through controlled exercises.  Complete these exercises as instructed by your physician, physical therapist or athletic trainer. Progress the resistance and repetitions only as guided.  You may experience muscle soreness or fatigue, but the pain or discomfort you are trying to eliminate should never worsen during these exercises. If this pain does worsen, stop and make certain you are following the directions exactly. If the pain is still present after adjustments, discontinue the exercise until you can discuss the trouble with your clinician.  STRENGTH - Plantar-flexors   Sit with your right / left leg extended. Holding onto both ends of a rubber exercise band/tubing, loop it around the ball of your foot. Keep a slight tension in the band.  Slowly push your toes away from you, pointing them downward.  Hold this position for 10 seconds. Return slowly, controlling the tension in the band/tubing. Repeat 3 times. Complete this exercise 2 times per day.   STRENGTH - Plantar-flexors   Stand with your feet shoulder width apart. Steady yourself with a wall or table using as little support as needed.  Keeping your weight evenly spread over the width of your feet, rise up on your toes.*  Hold this position for 10 seconds. Repeat 3 times. Complete this exercise 2 times per day.  *If this is too easy, shift your weight toward your right / left leg until you feel challenged. Ultimately, you may be asked to do this exercise with your right / left foot only.  STRENGTH  Plantar-flexors, Eccentric  Note: This exercise can place a lot of stress on your foot and ankle.  Please complete this exercise only if specifically instructed by your caregiver.   Place the balls of your feet on a step. With your hands, use only enough support from a wall or rail to keep your balance.  Keep your knees straight and rise up on your toes.  Slowly shift your weight entirely to your right / left toes and pick up your opposite foot. Gently and with controlled movement, lower your weight through your right / left foot so that your heel drops below the level of the step. You will feel a slight stretch in the back of your calf at the end position.  Use the healthy leg to help rise up onto the balls of both feet, then lower weight only on the right / left leg again. Build up to 15 repetitions. Then progress to 3 consecutive sets of 15 repetitions.*  After completing the above exercise, complete the same exercise with a slight knee bend (about 30 degrees). Again, build up to 15 repetitions. Then progress to 3 consecutive sets of 15 repetitions.* Perform this exercise 2 times per day.  *When you easily complete 3 sets of 15, your physician, physical therapist or athletic trainer may advise you to add resistance by wearing a backpack filled with additional weight.  STRENGTH - Plantar Flexors, Seated   Sit on a chair that allows your feet to rest flat on the ground. If  necessary, sit at the edge of the chair.  Keeping your toes firmly on the ground, lift your right / left heel as far as you can without increasing any discomfort in your ankle. Repeat 3 times. Complete this exercise 2 times a day.  Plantar Fasciitis (Heel Spur Syndrome) with Rehab The plantar fascia is a fibrous, ligament-like, soft-tissue structure that spans the bottom of the foot. Plantar fasciitis is a condition that causes pain in the foot due to inflammation of the tissue. SYMPTOMS   Pain and tenderness on the underneath side of the foot.  Pain that worsens with standing or walking. CAUSES  Plantar fasciitis  is caused by irritation and injury to the plantar fascia on the underneath side of the foot. Common mechanisms of injury include:  Direct trauma to bottom of the foot.  Damage to a small nerve that runs under the foot where the main fascia attaches to the heel bone.  Stress placed on the plantar fascia due to bone spurs. RISK INCREASES WITH:   Activities that place stress on the plantar fascia (running, jumping, pivoting, or cutting).  Poor strength and flexibility.  Improperly fitted shoes.  Tight calf muscles.  Flat feet.  Failure to warm-up properly before activity.  Obesity. PREVENTION  Warm up and stretch properly before activity.  Allow for adequate recovery between workouts.  Maintain physical fitness:  Strength, flexibility, and endurance.  Cardiovascular fitness.  Maintain a health body weight.  Avoid stress on the plantar fascia.  Wear properly fitted shoes, including arch supports for individuals who have flat feet.  PROGNOSIS  If treated properly, then the symptoms of plantar fasciitis usually resolve without surgery. However, occasionally surgery is necessary.  RELATED COMPLICATIONS   Recurrent symptoms that may result in a chronic condition.  Problems of the lower back that are caused by compensating for the injury, such as limping.  Pain or weakness of the foot during push-off following surgery.  Chronic inflammation, scarring, and partial or complete fascia tear, occurring more often from repeated injections.  TREATMENT  Treatment initially involves the use of ice and medication to help reduce pain and inflammation. The use of strengthening and stretching exercises may help reduce pain with activity, especially stretches of the Achilles tendon. These exercises may be performed at home or with a therapist. Your caregiver may recommend that you use heel cups of arch supports to help reduce stress on the plantar fascia. Occasionally, corticosteroid  injections are given to reduce inflammation. If symptoms persist for greater than 6 months despite non-surgical (conservative), then surgery may be recommended.   MEDICATION   If pain medication is necessary, then nonsteroidal anti-inflammatory medications, such as aspirin and ibuprofen, or other minor pain relievers, such as acetaminophen, are often recommended.  Do not take pain medication within 7 days before surgery.  Prescription pain relievers may be given if deemed necessary by your caregiver. Use only as directed and only as much as you need.  Corticosteroid injections may be given by your caregiver. These injections should be reserved for the most serious cases, because they may only be given a certain number of times.  HEAT AND COLD  Cold treatment (icing) relieves pain and reduces inflammation. Cold treatment should be applied for 10 to 15 minutes every 2 to 3 hours for inflammation and pain and immediately after any activity that aggravates your symptoms. Use ice packs or massage the area with a piece of ice (ice massage).  Heat treatment may be used prior to  performing the stretching and strengthening activities prescribed by your caregiver, physical therapist, or athletic trainer. Use a heat pack or soak the injury in warm water.  SEEK IMMEDIATE MEDICAL CARE IF:  Treatment seems to offer no benefit, or the condition worsens.  Any medications produce adverse side effects.  EXERCISES- RANGE OF MOTION (ROM) AND STRETCHING EXERCISES - Plantar Fasciitis (Heel Spur Syndrome) These exercises may help you when beginning to rehabilitate your injury. Your symptoms may resolve with or without further involvement from your physician, physical therapist or athletic trainer. While completing these exercises, remember:   Restoring tissue flexibility helps normal motion to return to the joints. This allows healthier, less painful movement and activity.  An effective stretch should be held  for at least 30 seconds.  A stretch should never be painful. You should only feel a gentle lengthening or release in the stretched tissue.  RANGE OF MOTION - Toe Extension, Flexion  Sit with your right / left leg crossed over your opposite knee.  Grasp your toes and gently pull them back toward the top of your foot. You should feel a stretch on the bottom of your toes and/or foot.  Hold this stretch for 10 seconds.  Now, gently pull your toes toward the bottom of your foot. You should feel a stretch on the top of your toes and or foot.  Hold this stretch for 10 seconds. Repeat  times. Complete this stretch 3 times per day.   RANGE OF MOTION - Ankle Dorsiflexion, Active Assisted  Remove shoes and sit on a chair that is preferably not on a carpeted surface.  Place right / left foot under knee. Extend your opposite leg for support.  Keeping your heel down, slide your right / left foot back toward the chair until you feel a stretch at your ankle or calf. If you do not feel a stretch, slide your bottom forward to the edge of the chair, while still keeping your heel down.  Hold this stretch for 10 seconds. Repeat 3 times. Complete this stretch 2 times per day.   STRETCH  Gastroc, Standing  Place hands on wall.  Extend right / left leg, keeping the front knee somewhat bent.  Slightly point your toes inward on your back foot.  Keeping your right / left heel on the floor and your knee straight, shift your weight toward the wall, not allowing your back to arch.  You should feel a gentle stretch in the right / left calf. Hold this position for 10 seconds. Repeat 3 times. Complete this stretch 2 times per day.  STRETCH  Soleus, Standing  Place hands on wall.  Extend right / left leg, keeping the other knee somewhat bent.  Slightly point your toes inward on your back foot.  Keep your right / left heel on the floor, bend your back knee, and slightly shift your weight over the back  leg so that you feel a gentle stretch deep in your back calf.  Hold this position for 10 seconds. Repeat 3 times. Complete this stretch 2 times per day.  STRETCH  Gastrocsoleus, Standing  Note: This exercise can place a lot of stress on your foot and ankle. Please complete this exercise only if specifically instructed by your caregiver.   Place the ball of your right / left foot on a step, keeping your other foot firmly on the same step.  Hold on to the wall or a rail for balance.  Slowly lift your other foot,  allowing your body weight to press your heel down over the edge of the step.  You should feel a stretch in your right / left calf.  Hold this position for 10 seconds.  Repeat this exercise with a slight bend in your right / left knee. Repeat 3 times. Complete this stretch 2 times per day.   STRENGTHENING EXERCISES - Plantar Fasciitis (Heel Spur Syndrome)  These exercises may help you when beginning to rehabilitate your injury. They may resolve your symptoms with or without further involvement from your physician, physical therapist or athletic trainer. While completing these exercises, remember:   Muscles can gain both the endurance and the strength needed for everyday activities through controlled exercises.  Complete these exercises as instructed by your physician, physical therapist or athletic trainer. Progress the resistance and repetitions only as guided.  STRENGTH - Towel Curls  Sit in a chair positioned on a non-carpeted surface.  Place your foot on a towel, keeping your heel on the floor.  Pull the towel toward your heel by only curling your toes. Keep your heel on the floor. Repeat 3 times. Complete this exercise 2 times per day.  STRENGTH - Ankle Inversion  Secure one end of a rubber exercise band/tubing to a fixed object (table, pole). Loop the other end around your foot just before your toes.  Place your fists between your knees. This will focus your  strengthening at your ankle.  Slowly, pull your big toe up and in, making sure the band/tubing is positioned to resist the entire motion.  Hold this position for 10 seconds.  Have your muscles resist the band/tubing as it slowly pulls your foot back to the starting position. Repeat 3 times. Complete this exercises 2 times per day.  Document Released: 04/14/2005 Document Revised: 07/07/2011 Document Reviewed: 07/27/2008 [facility] Patient Information 2014 [place], [place].

## 2019-03-15 ENCOUNTER — Emergency Department: Payer: Self-pay

## 2019-03-15 ENCOUNTER — Other Ambulatory Visit: Payer: Self-pay

## 2019-03-15 ENCOUNTER — Encounter: Payer: Self-pay | Admitting: Emergency Medicine

## 2019-03-15 ENCOUNTER — Emergency Department
Admission: EM | Admit: 2019-03-15 | Discharge: 2019-03-15 | Disposition: A | Discharge status: Home / Self Care | Payer: Self-pay | Attending: Emergency Medicine | Admitting: Emergency Medicine

## 2019-03-15 DIAGNOSIS — Z79899 Other long term (current) drug therapy: Secondary | ICD-10-CM | POA: Insufficient documentation

## 2019-03-15 DIAGNOSIS — B349 Viral infection, unspecified: Secondary | ICD-10-CM | POA: Insufficient documentation

## 2019-03-15 DIAGNOSIS — Z87891 Personal history of nicotine dependence: Secondary | ICD-10-CM | POA: Insufficient documentation

## 2019-03-15 DIAGNOSIS — U071 COVID-19: Secondary | ICD-10-CM | POA: Insufficient documentation

## 2019-03-15 LAB — SARS CORONAVIRUS 2 (TAT 6-24 HRS): SARS Coronavirus 2: POSITIVE — AB

## 2019-03-15 LAB — INFLUENZA PANEL BY PCR (TYPE A & B)
Influenza A By PCR: NEGATIVE
Influenza B By PCR: NEGATIVE

## 2019-03-15 MED ORDER — TRAMADOL HCL 50 MG PO TABS: 50.0000 mg | ORAL_TABLET | Freq: Four times a day (QID) | ORAL | 0 refills | Status: DC | PRN

## 2019-03-15 MED ORDER — IBUPROFEN 600 MG PO TABS: 600.0000 mg | ORAL_TABLET | Freq: Three times a day (TID) | ORAL | 0 refills | Status: DC | PRN

## 2019-03-15 MED ORDER — PSEUDOEPH-BROMPHEN-DM 30-2-10 MG/5ML PO SYRP: 5.0000 mL | ORAL_SOLUTION | Freq: Four times a day (QID) | ORAL | 0 refills | Status: DC | PRN

## 2019-03-15 MED ORDER — IBUPROFEN 600 MG PO TABS
600.0000 mg | ORAL_TABLET | Freq: Once | ORAL | Status: AC
Start: 2019-03-15 — End: 2019-03-15
  Administered 2019-03-15: 13:00:00 600 mg via ORAL
  Filled 2019-03-15: qty 1

## 2019-03-15 NOTE — ED Triage Notes (Signed)
Pt reports flu-like sx's since getting the flu shot in early November.

## 2019-03-15 NOTE — ED Provider Notes (Signed)
[facility] Emergency Department Provider Note   ____________________________________________   First MD Initiated Contact with Patient 03/15/19 1205     (approximate)  I have reviewed the triage vital signs and the nursing notes.   HISTORY  Chief Complaint Fever, Generalized Body Aches, Headache, and Cough    HPI Margaret Hansen is a 58 y.o. female patient complain of flulike symptoms for 3 weeks.  Patient states she took a flu shot the first week of November.  Patient is a approximate 2 to 3 days after discharge she started developing flulike symptoms consisting mostly of intermittent fever, fatigue, nausea, and body aches.  Patient denies recent travel or known exposure to COVID-19.  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  Patient rates her pain as a 10/10.  Patient describes her pain as "achy".  No palliative measure except for Tylenol for complaint.         History reviewed. No pertinent past medical history.  There are no active problems to display for this patient.   History reviewed. No pertinent surgical history.  Prior to Admission medications   Medication Sig Start Date End Date Taking? Authorizing Provider  brompheniramine-pseudoephedrine-DM 30-2-10 MG/5ML syrup Take 5 mLs by mouth 4 (four) times daily as needed. 03/15/19   Joni Reining, PA-C  cyclobenzaprine (FLEXERIL) 5 MG tablet TAKE 1 TABLET 3 TIMES A DAY AS NEEDED FOR SPASMS 01/07/16   [provider]  diclofenac (VOLTAREN) 75 MG EC tablet TAKE 1 TABLET TWICE A DAY WITH FOOD OR MILK AS NEEDED FOR PAIN 01/28/16   [provider]  ibuprofen (ADVIL) 600 MG tablet Take 1 tablet (600 mg total) by mouth every 8 (eight) hours as needed. 03/15/19   Joni Reining, PA-C  pantoprazole (PROTONIX) 40 MG tablet TAKE 1 TABLET BY MOUTH ONCE DAILY IF NEEDED 01/12/16   [provider]  sertraline (ZOLOFT) 100 MG tablet Take 100 mg by mouth daily. 03/16/16   [provider]  traMADol (ULTRAM) 50 MG tablet Take 1 tablet (50 mg total) by mouth every 6 (six) hours as needed for moderate pain. 03/15/19   Joni Reining, PA-C    Allergies Patient has no known allergies.  No family history on file.  Social History Social History   Tobacco Use  . Smoking status: Former Games developer  . Smokeless tobacco: Never Used  Substance Use Topics  . Alcohol use: Not on file  . Drug use: Not on file    Review of Systems  Constitutional: Fever, fatigue, and body aches. Eyes: No visual changes. ENT: No sore throat.  Nasal congestion. Cardiovascular: Denies chest pain. Respiratory: Denies shortness of breath. Gastrointestinal: No abdominal pain.  No nausea, no vomiting.  No diarrhea.  No constipation. Genitourinary: Negative for dysuria. Musculoskeletal: Negative for back pain. Skin: Negative for rash. Neurological: Negative for headaches, focal weakness or numbness.   ____________________________________________   PHYSICAL EXAM:  VITAL SIGNS: ED Triage Vitals  Enc Vitals Group     BP 03/15/19 1142 (!) 133/102     Pulse Rate 03/15/19 1142 (!) 106     Resp 03/15/19 1142 20     Temp 03/15/19 1142 (!) 103 F (39.4 C)     Temp Source 03/15/19 1142 Oral     SpO2 03/15/19 1142 96 %     Weight 03/15/19 1143 220 lb (99.8 kg)  Height 03/15/19 1143 4\' 11"  (1.499 m)     Head Circumference --      Peak Flow --      Pain Score 03/15/19 1143 10     Pain Loc --      Pain Edu? --      Excl. in [place]? --    Constitutional: Alert and oriented. Well appearing and in no acute distress.  Febrile Eyes: Conjunctivae are normal. PERRL. EOMI. Head: Atraumatic. Nose: Edematous nasal turbinates clear rhinorrhea.  Mouth/Throat: Mucous membranes are moist.  Oropharynx non-erythematous.  Postnasal drainage. Neck: No stridor.  Cardiovascular: Normal rate, regular rhythm. Grossly  normal heart sounds.  Good peripheral circulation. Respiratory: Normal respiratory effort.  No retractions. Lungs CTAB. Gastrointestinal: Soft and nontender. No distention. No abdominal bruits. No CVA tenderness. Genitourinary: Deferred Musculoskeletal: No lower extremity tenderness nor edema.  No joint effusions. Neurologic:  Normal speech and language. No gross focal neurologic deficits are appreciated. No gait instability. Skin:  Skin is warm, dry and intact. No rash noted. Psychiatric: Mood and affect are normal. Speech and behavior are normal.  ____________________________________________   LABS (all labs ordered are listed, but only abnormal results are displayed)  Labs Reviewed  SARS CORONAVIRUS 2 (TAT 6-24 HRS)  INFLUENZA PANEL BY PCR (TYPE A & B)   ____________________________________________  EKG   ____________________________________________  RADIOLOGY  ED MD interpretation:    Official radiology report(s): Dg Chest Portable 1 View  Result Date: 03/15/2019 CLINICAL DATA:  Fever and chest congestion. Fever. EXAM: PORTABLE CHEST 1 VIEW COMPARISON:  Chest x-ray dated 05/29/2014 and CT scan of the abdomen dated 10/14/2007 FINDINGS: Heart size and pulmonary vascularity are normal and the lungs are clear. Small hiatal hernia. No significant bone abnormality. IMPRESSION: No acute abnormalities. Small hiatal hernia. Electronically Signed   By: Francene BoyersJames  Maxwell M.D.   On: 03/15/2019 12:44    ____________________________________________   PROCEDURES  Procedure(s) performed (including Critical Care):  Procedures   ____________________________________________   INITIAL IMPRESSION / ASSESSMENT AND PLAN / ED COURSE  As part of my medical decision making, I reviewed the following data within the electronic MEDICAL RECORD NUMBER      Margaret Hansen was evaluated in Emergency Department on 03/15/2019 for the symptoms described in the history of present illness. She was  evaluated in the context of the global COVID-19 pandemic, which necessitated consideration that the patient might be at risk for infection with the SARS-CoV-2 virus that causes COVID-19. Institutional protocols and algorithms that pertain to the evaluation of patients at risk for COVID-19 are in a state of rapid change based on information released by regulatory bodies including the CDC and federal and state organizations. These policies and algorithms were followed during the patient's care in the ED.  Patient presents flulike illness for 3 weeks.  Patient state onset of complaint after taking flu shot.  Discussed negative flu results with patient.  Discussed negative checks x-ray findings with patient.  Patient advised self quarantine pending results of COVID-19 test.  Patient given discharge care instruction advised take medication as directed.         ____________________________________________   FINAL CLINICAL IMPRESSION(S) / ED DIAGNOSES  Final diagnoses:  Viral illness     ED Discharge Orders         Ordered    brompheniramine-pseudoephedrine-DM 30-2-10 MG/5ML syrup  4 times daily PRN     03/15/19 1405    traMADol (ULTRAM) 50 MG tablet  Every 6 hours PRN  03/15/19 1405    ibuprofen (ADVIL) 600 MG tablet  Every 8 hours PRN     03/15/19 1405           Note:  This document was prepared using Dragon voice recognition software and may include unintentional dictation errors.    Sable Feil, PA-C 03/15/19 1407    Earleen Newport, MD 03/15/19 [id number]

## 2019-03-15 NOTE — ED Notes (Signed)
Positive covid result called from [facility] lab, attempted to call patient result without success.

## 2019-03-15 NOTE — ED Notes (Signed)
See triage note  States she developed fever and body aches since getting a flu shot on 11/05  States her temp has been 102 and 103 at home

## 2019-03-16 ENCOUNTER — Telehealth: Payer: Self-pay | Admitting: Emergency Medicine

## 2019-03-16 NOTE — ED Notes (Signed)
Patient returned call from last evening.  Margaret Hansen spoke with patient and informed her of her positive COVID test.   Understanding verbalized.

## 2019-03-16 NOTE — Telephone Encounter (Signed)
Conversation with patient regarding COVID results, quarantine and reporting back to the ED with difficulty breathing or other urgent concerns. Pt verbalized understanding.

## 2019-06-08 DIAGNOSIS — I83891 Varicose veins of right lower extremities with other complications: Secondary | ICD-10-CM | POA: Diagnosis not present

## 2020-10-01 DIAGNOSIS — E7800 Pure hypercholesterolemia, unspecified: Secondary | ICD-10-CM | POA: Diagnosis not present

## 2020-10-01 DIAGNOSIS — F339 Major depressive disorder, recurrent, unspecified: Secondary | ICD-10-CM | POA: Diagnosis not present

## 2020-10-01 DIAGNOSIS — K219 Gastro-esophageal reflux disease without esophagitis: Secondary | ICD-10-CM | POA: Diagnosis not present

## 2021-02-15 DIAGNOSIS — R059 Cough, unspecified: Secondary | ICD-10-CM | POA: Diagnosis not present

## 2021-06-26 DIAGNOSIS — D500 Iron deficiency anemia secondary to blood loss (chronic): Secondary | ICD-10-CM

## 2021-06-26 HISTORY — DX: Iron deficiency anemia secondary to blood loss (chronic): D50.0

## 2021-06-27 ENCOUNTER — Ambulatory Visit
Admission: RE | Admit: 2021-06-27 | Discharge: 2021-06-27 | Disposition: A | Discharge status: Home / Self Care | Payer: Managed Care, Other (non HMO) | Source: Ambulatory Visit | Attending: Family Medicine | Admitting: Family Medicine

## 2021-06-27 ENCOUNTER — Other Ambulatory Visit: Payer: Self-pay | Admitting: Family Medicine

## 2021-06-27 DIAGNOSIS — R0602 Shortness of breath: Secondary | ICD-10-CM

## 2021-06-28 ENCOUNTER — Emergency Department: Payer: Managed Care, Other (non HMO)

## 2021-06-28 ENCOUNTER — Observation Stay
Admission: EM | Admit: 2021-06-28 | Discharge: 2021-06-29 | Disposition: A | Discharge status: Home / Self Care | Payer: Managed Care, Other (non HMO) | Attending: Family Medicine | Admitting: Family Medicine

## 2021-06-28 ENCOUNTER — Other Ambulatory Visit: Payer: Self-pay

## 2021-06-28 ENCOUNTER — Encounter: Payer: Self-pay | Admitting: Emergency Medicine

## 2021-06-28 DIAGNOSIS — Z20822 Contact with and (suspected) exposure to covid-19: Secondary | ICD-10-CM | POA: Insufficient documentation

## 2021-06-28 DIAGNOSIS — Z6841 Body Mass Index (BMI) 40.0 and over, adult: Secondary | ICD-10-CM | POA: Diagnosis not present

## 2021-06-28 DIAGNOSIS — D509 Iron deficiency anemia, unspecified: Secondary | ICD-10-CM | POA: Diagnosis not present

## 2021-06-28 DIAGNOSIS — D649 Anemia, unspecified: Secondary | ICD-10-CM | POA: Diagnosis present

## 2021-06-28 DIAGNOSIS — Z87891 Personal history of nicotine dependence: Secondary | ICD-10-CM | POA: Diagnosis not present

## 2021-06-28 DIAGNOSIS — E876 Hypokalemia: Secondary | ICD-10-CM

## 2021-06-28 DIAGNOSIS — F32A Depression, unspecified: Secondary | ICD-10-CM | POA: Diagnosis present

## 2021-06-28 DIAGNOSIS — K449 Diaphragmatic hernia without obstruction or gangrene: Secondary | ICD-10-CM | POA: Diagnosis not present

## 2021-06-28 DIAGNOSIS — E8809 Other disorders of plasma-protein metabolism, not elsewhere classified: Secondary | ICD-10-CM

## 2021-06-28 HISTORY — DX: Gastro-esophageal reflux disease without esophagitis: K21.9

## 2021-06-28 HISTORY — DX: Depression, unspecified: F32.A

## 2021-06-28 LAB — IRON AND TIBC
Iron: 16 ug/dL — ABNORMAL LOW (ref 28–170)
Saturation Ratios: 4 % — ABNORMAL LOW (ref 10.4–31.8)
TIBC: 420 ug/dL (ref 250–450)
UIBC: 404 ug/dL

## 2021-06-28 LAB — CBC WITH DIFFERENTIAL/PLATELET
Abs Immature Granulocytes: 0.07 10*3/uL (ref 0.00–0.07)
Basophils Absolute: 0 10*3/uL (ref 0.0–0.1)
Basophils Relative: 0 %
Eosinophils Absolute: 0.2 10*3/uL (ref 0.0–0.5)
Eosinophils Relative: 2 %
HCT: 21.3 % — ABNORMAL LOW (ref 36.0–46.0)
Hemoglobin: 5.8 g/dL — ABNORMAL LOW (ref 12.0–15.0)
Immature Granulocytes: 1 %
Lymphocytes Relative: 23 %
Lymphs Abs: 1.8 10*3/uL (ref 0.7–4.0)
MCH: 18.2 pg — ABNORMAL LOW (ref 26.0–34.0)
MCHC: 27.2 g/dL — ABNORMAL LOW (ref 30.0–36.0)
MCV: 66.8 fL — ABNORMAL LOW (ref 80.0–100.0)
Monocytes Absolute: 0.5 10*3/uL (ref 0.1–1.0)
Monocytes Relative: 6 %
Neutro Abs: 5.3 10*3/uL (ref 1.7–7.7)
Neutrophils Relative %: 68 %
Platelets: 318 10*3/uL (ref 150–400)
RBC: 3.19 MIL/uL — ABNORMAL LOW (ref 3.87–5.11)
RDW: 18.4 % — ABNORMAL HIGH (ref 11.5–15.5)
Smear Review: NORMAL
WBC: 7.8 10*3/uL (ref 4.0–10.5)
nRBC: 0.3 % — ABNORMAL HIGH (ref 0.0–0.2)

## 2021-06-28 LAB — BASIC METABOLIC PANEL
Anion gap: 5 (ref 5–15)
BUN: 20 mg/dL (ref 8–23)
CO2: 28 mmol/L (ref 22–32)
Calcium: 8.4 mg/dL — ABNORMAL LOW (ref 8.9–10.3)
Chloride: 110 mmol/L (ref 98–111)
Creatinine, Ser: 0.73 mg/dL (ref 0.44–1.00)
GFR, Estimated: >60 mL/min (ref >60)
Glucose, Bld: 123 mg/dL — ABNORMAL HIGH (ref 70–99)
Potassium: 3.3 mmol/L — ABNORMAL LOW (ref 3.5–5.1)
Sodium: 143 mmol/L (ref 135–145)

## 2021-06-28 LAB — RESP PANEL BY RT-PCR (FLU A&B, COVID) ARPGX2
Influenza A by PCR: NEGATIVE
Influenza B by PCR: NEGATIVE
SARS Coronavirus 2 by RT PCR: NEGATIVE

## 2021-06-28 LAB — ABO/RH: ABO/RH(D): O NEG

## 2021-06-28 LAB — HEPATIC FUNCTION PANEL
ALT: 9 U/L (ref 0–44)
AST: 12 U/L — ABNORMAL LOW (ref 15–41)
Albumin: 2.9 g/dL — ABNORMAL LOW (ref 3.5–5.0)
Alkaline Phosphatase: 72 U/L (ref 38–126)
Bilirubin, Direct: <0.1 mg/dL (ref 0.0–0.2)
Total Bilirubin: 0.4 mg/dL (ref 0.3–1.2)
Total Protein: 6.4 g/dL — ABNORMAL LOW (ref 6.5–8.1)

## 2021-06-28 LAB — HIV ANTIBODY (ROUTINE TESTING W REFLEX): HIV Screen 4th Generation wRfx: NONREACTIVE

## 2021-06-28 LAB — RETICULOCYTES
Immature Retic Fract: 25.2 % — ABNORMAL HIGH (ref 2.3–15.9)
RBC.: 3.16 MIL/uL — ABNORMAL LOW (ref 3.87–5.11)
Retic Count, Absolute: 74.9 10*3/uL (ref 19.0–186.0)
Retic Ct Pct: 2.4 % (ref 0.4–3.1)

## 2021-06-28 LAB — BRAIN NATRIURETIC PEPTIDE: B Natriuretic Peptide: 78.3 pg/mL (ref 0.0–100.0)

## 2021-06-28 LAB — PREPARE RBC (CROSSMATCH)

## 2021-06-28 MED ORDER — SODIUM CHLORIDE 0.9% FLUSH
3.0000 mL | Freq: Two times a day (BID) | INTRAVENOUS | Status: DC
  Administered 2021-06-28: 3 mL via INTRAVENOUS

## 2021-06-28 MED ORDER — SODIUM CHLORIDE 0.9 % IV SOLN
10.0000 mL/h | Freq: Once | INTRAVENOUS | Status: AC
  Administered 2021-06-28: 10 mL/h via INTRAVENOUS

## 2021-06-28 MED ORDER — POTASSIUM CHLORIDE CRYS ER 20 MEQ PO TBCR
40.0000 meq | EXTENDED_RELEASE_TABLET | Freq: Once | ORAL | Status: AC
  Administered 2021-06-28: 40 meq via ORAL
  Filled 2021-06-28: qty 2

## 2021-06-28 MED ORDER — PANTOPRAZOLE SODIUM 40 MG IV SOLR
40.0000 mg | Freq: Two times a day (BID) | INTRAVENOUS | Status: DC
Start: 2021-06-28 — End: 2021-06-29
  Administered 2021-06-28 (×2): 40 mg via INTRAVENOUS
  Filled 2021-06-28 (×2): qty 10

## 2021-06-28 MED ORDER — PANTOPRAZOLE SODIUM 40 MG PO TBEC: 40.0000 mg | DELAYED_RELEASE_TABLET | Freq: Every day | ORAL | Status: DC

## 2021-06-28 MED ORDER — ACETAMINOPHEN 500 MG PO TABS: 1000.0000 mg | ORAL_TABLET | Freq: Four times a day (QID) | ORAL | Status: DC | PRN

## 2021-06-28 MED ORDER — DULOXETINE HCL 30 MG PO CPEP
30.0000 mg | ORAL_CAPSULE | Freq: Every day | ORAL | Status: DC
  Filled 2021-06-28 (×4): qty 1

## 2021-06-28 MED ORDER — SODIUM CHLORIDE 0.9 % IV SOLN
250.0000 mL | INTRAVENOUS | Status: DC | PRN
Start: 2021-06-28 — End: 2021-06-29

## 2021-06-28 MED ORDER — ONDANSETRON HCL 4 MG/2ML IJ SOLN: 4.0000 mg | Freq: Four times a day (QID) | INTRAMUSCULAR | Status: DC | PRN

## 2021-06-28 MED ORDER — MECLIZINE HCL 25 MG PO TABS
25.0000 mg | ORAL_TABLET | Freq: Two times a day (BID) | ORAL | Status: DC | PRN
  Filled 2021-06-28: qty 1

## 2021-06-28 MED ORDER — SODIUM CHLORIDE 0.9% FLUSH
3.0000 mL | INTRAVENOUS | Status: DC | PRN
Start: 2021-06-28 — End: 2021-06-29

## 2021-06-28 MED ORDER — ONDANSETRON HCL 4 MG PO TABS
4.0000 mg | ORAL_TABLET | Freq: Four times a day (QID) | ORAL | Status: DC | PRN
Start: 2021-06-28 — End: 2021-06-29

## 2021-06-28 NOTE — ED Notes (Signed)
This RN called blood bank to see if blood is ready. Blood bank states give them 5 minutes and blood will be ready ? ?

## 2021-06-28 NOTE — H&P (Addendum)
?History and Physical  ? ? ?Patient: Margaret Hansen JKD:[id number] DOB: 1960-08-04 ?DOA: 06/28/2021 ?DOS: the patient was seen and examined on 06/28/2021 ?PCP: Irven Coe, MD  ?Patient coming from: Home ? ?Chief Complaint:  ?Chief Complaint  ?Patient presents with  ? Abnormal Lab  ? ? ?HPI: Margaret Hansen is a 61 y.o. female with medical history significant for depression, GERD, morbid obesity who presents to the ER at the request of her PCP for evaluation of anemia. ?Patient states that over the last couple of weeks she has had exertional shortness of breath and has felt fatigued.  She has had intermittent dizziness that she attributed to her history of vertigo.  She denies having any chest pain, no loss of consciousness, no fall, no hematuria, no hematochezia, no melena or hematemesis. ?She admits to NSAID use about a couple of months ago for chronic right hip pain and states that at the time she took about 800 mg twice daily but has cut back because her pain has improved. ?She denies having any nausea, no vomiting, no urinary symptoms, no fever, no chills, no cough, no palpitations, no diaphoresis, no focal deficits or blurred vision. ?She was contacted by her PCP and told her hemoglobin was 6 and was advised to come to the ER for further evaluation. ? ?Review of Systems: As mentioned in the history of present illness. All other systems reviewed and are negative. ?Past Medical History:  ?Diagnosis Date  ? Depression   ? GERD (gastroesophageal reflux disease)   ? ?Past Surgical History:  ?Procedure Laterality Date  ? CESAREAN SECTION    ? CHOLECYSTECTOMY    ? ?Social History:  reports that she has quit smoking. She has never used smokeless tobacco. She reports that she does not currently use alcohol. She reports that she does not use drugs. ? ?No Known Allergies ? ?No family history on file. ? ?Prior to Admission medications   ?Medication Sig Start Date End Date Taking? Authorizing Provider  ?DULoxetine (CYMBALTA) 30  MG capsule Take 30 mg by mouth daily. 06/17/21  Yes [provider]  ?pantoprazole (PROTONIX) 40 MG tablet Take 40 mg by mouth daily. 01/12/16  Yes [provider]  ?acetaminophen (TYLENOL) 500 MG tablet Take 500-1,000 mg by mouth every 6 (six) hours as needed.    [provider]  ?meclizine (ANTIVERT) 25 MG tablet Take 25 mg by mouth every 12 (twelve) hours as needed for dizziness. 05/23/21   [provider]  ? ? ?Physical Exam: ?Vitals:  ? 06/28/21 1450 06/28/21 1500 06/28/21 1510 06/28/21 1520  ?BP:  102/60    ?Pulse: 77 77 81 78  ?Resp: (!) 23 (!) 23 (!) 22 (!) 24  ?Temp:      ?TempSrc:      ?SpO2: 100% 98% 97% 100%  ?Weight:      ?Height:      ? ?Physical Exam ?Vitals and nursing note reviewed.  ?Constitutional:   ?   Appearance: Normal appearance. She is obese.  ?HENT:  ?   Head: Normocephalic and atraumatic.  ?   Nose: Nose normal.  ?   Mouth/Throat:  ?   Mouth: Mucous membranes are moist.  ?Eyes:  ?   Comments: Pale conjunctiva  ?Cardiovascular:  ?   Rate and Rhythm: Normal rate and regular rhythm.  ?Pulmonary:  ?   Effort: Pulmonary effort is normal.  ?   Breath sounds: Normal breath sounds.  ?Abdominal:  ?   General: Bowel sounds are  normal.  ?   Palpations: Abdomen is soft.  ?   Comments: Central adiposity  ?Musculoskeletal:     ?   General: Normal range of motion.  ?   Cervical back: Normal range of motion and neck supple.  ?Skin: ?   General: Skin is warm and dry.  ?Neurological:  ?   General: No focal deficit present.  ?   Mental Status: She is alert and oriented to person, place, and time.  ?Psychiatric:     ?   Mood and Affect: Mood normal.     ?   Behavior: Behavior normal.  ? ? ? ?Data Reviewed: ?Relevant notes from primary care and specialist visits, past discharge summaries as available in EHR, including Care Everywhere. ?Prior diagnostic testing as pertinent to current admission diagnoses ?Updated medications and problem lists for reconciliation ?ED course,  including vitals, labs, imaging, treatment and response to treatment ?Triage notes, nursing and pharmacy notes and ED provider's notes ?Notable results as noted in HPI ?Labs reviewed.  Hemoglobin 5.8 compared to baseline of 11.5 about 13 years ago, MCV 66.8, RDW 18.4, platelet count 318, serum iron 16, potassium 3.3 ?Chest x-ray reviewed by me shows a hiatal hernia with no acute cardiopulmonary disease.  Cardiomegaly ?There are no new results to review at this time. ? ?Assessment and Plan: ?* Symptomatic anemia ?Patient presents for evaluation of exertional shortness of breath and fatigue and is found to have a hemoglobin of 5.8g/dl ?Serum iron is low at 16 and MCV is low as well consistent with microcytic anemia ?Patient was transfused 2 units of packed RBC in the ER ?Concern for possible NSAID induced gastritis versus peptic ulcer disease ?Start patient on IV PPI ?We will consult GI for evaluation of symptomatic iron deficiency anemia ? ?Morbid obesity with BMI of 40.0-44.9, adult (HCC) ?BMI is 44.44 ?Complicates overall prognosis and care ? ?Depression ?Stable ?Continue Cymbalta ? ? ? ? ? ? ?Advance Care Planning:   Code Status: Full Code  ? ?Consults: Gastroenterology ? ?Family Communication: Greater than 50% of time was spent discussing patient's condition and plan of care with her at the bedside.  All questions and concerns have been addressed.  She verbalizes understanding and agrees with the plan. ? ?Severity of Illness: ?The appropriate patient status for this patient is OBSERVATION. Observation status is judged to be reasonable and necessary in order to provide the required intensity of service to ensure the patient's safety. The patient's presenting symptoms, physical exam findings, and initial radiographic and laboratory data in the context of their medical condition is felt to place them at decreased risk for further clinical deterioration. Furthermore, it is anticipated that the patient will be  medically stable for discharge from the hospital within 2 midnights of admission.  ? ?Author: ?Lucile Shutters, MD ?06/28/2021 3:31 PM ? ?For on call review [email or web address].  ?

## 2021-06-28 NOTE — ED Notes (Signed)
1 green tube ?2 purple tubes ?2 gold tubes ?1 pink tube collected and sent to the lab by this RN. ?

## 2021-06-28 NOTE — Assessment & Plan Note (Addendum)
Weight loss recommended 

## 2021-06-28 NOTE — ED Triage Notes (Addendum)
[facility] PCP yesterday, had blood drawn.  Called today and told that blood count was 6 and needed to come to ED for a unit of blood. ? ?Patient denies all complaint. ? ?AAOX3  skin warm and dry. NAD ?

## 2021-06-28 NOTE — ED Provider Notes (Signed)
? ?[facility] ?Provider Note ? ? ? Event Date/Time  ? First MD Initiated Contact with Patient 06/28/21 1129   ?  (approximate) ? ? ?History  ? ?Abnormal Lab ? ? ?HPI ? ?Margaret Hansen is a 61 y.o. female with a past medical history of vertigo, some chronic back pain previously on fairly high doses of NSAIDs although patient states she has not been taking his regularly for several weeks, and obesity who presents for evaluation of anemia seen on routine labs obtained by her PCP yesterday.  Patient had initially been evaluated for several weeks of progressively worsening fatigue and shortness of breath with exertion.  She denies any shortness of breath at rest, cough, fevers, headache, earache, sore throat, chest pain, vomiting, diarrhea, constipation or any recent injuries or falls.  She denies any recent melanotic or maroon-colored or bright red-colored stools or any blood in her urine or any heavy vaginal bleeding.  She states she think she had a colonoscopy between 5 and 10 years ago was normal but does not remember.  He is not on any other blood thinners.  No history of cirrhosis or significant EtOH use. ? ?  ? ? ?Physical Exam  ?Triage Vital Signs: ?ED Triage Vitals  ?Enc Vitals Group  ?   BP 06/28/21 1025 (!) 110/54  ?   Pulse Rate 06/28/21 1025 83  ?   Resp 06/28/21 1025 16  ?   Temp 06/28/21 1025 99.1 ?F (37.3 ?C)  ?   Temp Source 06/28/21 1025 Oral  ?   SpO2 06/28/21 1025 98 %  ?   Weight 06/28/21 1021 220 lb 0.3 oz (99.8 kg)  ?   Height 06/28/21 1021 4\' 11"  (1.499 m)  ?   Head Circumference --   ?   Peak Flow --   ?   Pain Score 06/28/21 1021 0  ?   Pain Loc --   ?   Pain Edu? --   ?   Excl. in [place]? --   ? ? ?Most recent vital signs: ?Vitals:  ? 06/28/21 1345 06/28/21 1417  ?BP:  121/63  ?Pulse: 72 81  ?Resp: (!) 24 20  ?Temp:  99 ?F (37.2 ?C)  ?SpO2: 99% 100%  ? ? ?General: Awake, no distress.  ?CV:  Good peripheral perfusion.  2+ radial pulse. ?Resp:  Normal effort.  ?Abd:  No  distention.  Soft throughout. ?Other:  Mild lower extremity edema. ? ? ?ED Results / Procedures / Treatments  ?Labs ?(all labs ordered are listed, but only abnormal results are displayed) ?Labs Reviewed  ?CBC WITH DIFFERENTIAL/PLATELET - Abnormal; Notable for the following components:  ?    Result Value  ? RBC 3.19 (*)   ? Hemoglobin 5.8 (*)   ? HCT 21.3 (*)   ? MCV 66.8 (*)   ? MCH 18.2 (*)   ? MCHC 27.2 (*)   ? RDW 18.4 (*)   ? nRBC 0.3 (*)   ? All other components within normal limits  ?BASIC METABOLIC PANEL - Abnormal; Notable for the following components:  ? Potassium 3.3 (*)   ? Glucose, Bld 123 (*)   ? Calcium 8.4 (*)   ? All other components within normal limits  ?RETICULOCYTES - Abnormal; Notable for the following components:  ? RBC. 3.16 (*)   ? Immature Retic Fract 25.2 (*)   ? All other components within normal limits  ?IRON AND TIBC - Abnormal; Notable for the following components:  ?  Iron 16 (*)   ? Saturation Ratios 4 (*)   ? All other components within normal limits  ?HEPATIC FUNCTION PANEL - Abnormal; Notable for the following components:  ? Total Protein 6.4 (*)   ? Albumin 2.9 (*)   ? AST 12 (*)   ? All other components within normal limits  ?RESP PANEL BY RT-PCR (FLU A&B, COVID) ARPGX2  ?BRAIN NATRIURETIC PEPTIDE  ?TYPE AND SCREEN  ?PREPARE RBC (CROSSMATCH)  ?ABO/RH  ? ? ? ?EKG ? ?ECG shows sinus rhythm with a ventricular rate of 74, right axis deviation with otherwise unremarkable intervals and no evidence of acute ischemia or other significant arrhythmia. ? ? ?RADIOLOGY ?Chest reviewed by myself shows no focal consoidation, effusion, edema, pneumothorax or other clear acute thoracic process. I also reviewed radiology interpretation and agree with findings described.  They do make notation of a hiatal hernia. ? ? ? ?PROCEDURES: ? ?Critical Care performed: Yes, see critical care procedure note(s) ? ?.Critical Care ?Performed by: Lucrezia Starch, MD ?Authorized by: Lucrezia Starch, MD   ? ?Critical care provider statement:  ?  Critical care time (minutes):  30 ?  Critical care was necessary to treat or prevent imminent or life-threatening deterioration of the following conditions:  Circulatory failure ?  Critical care was time spent personally by me on the following activities:  Development of treatment plan with patient or surrogate, discussions with consultants, evaluation of patient's response to treatment, examination of patient, ordering and review of laboratory studies, ordering and review of radiographic studies, ordering and performing treatments and interventions, pulse oximetry, re-evaluation of patient's condition and review of old charts ? ? ? ?MEDICATIONS ORDERED IN ED: ?Medications  ?potassium chloride SA (KLOR-CON M) CR tablet 40 mEq (has no administration in time range)  ?0.9 %  sodium chloride infusion (10 mL/hr Intravenous New Bag/Given 06/28/21 1428)  ? ? ? ?IMPRESSION / MDM / ASSESSMENT AND PLAN / ED COURSE  ?I reviewed the triage vital signs and the nursing notes. ?             ?               ? ?Differential diagnosis includes, but is not limited to shortness of breath and fatigue related to anemia, arrhythmia, CHF, pneumonia, orchitis with a lower suspicion for a PE at this time. ? ?Chest x-ray and EKG are unremarkable for evidence of pneumonia, thorax, effusion, edema, cardiac ischemia or arrhythmia. ? ?BMP shows a K of 3.3 without any other significant electrolyte or metabolic derangements.  CBC shows WBC count of 7.8 with hemoglobin of 5.8 and normal platelets.  BNP is double digits.  Overall this is not consistent with acute volume overload.  Hepatic function panel shows a low albumin at 2.9 which could certainly be contributing to her edema.  There is no evidence of elevated bilirubin and have a lower suspicion for hemolysis.  Reticulocyte count is 2.4.  Iron panel sent.  2 units of PRBCs ordered for symptomatic anemia.  I will admit to medicine service for further  evaluation and management of this anemia. ? ?  ? ? ?FINAL CLINICAL IMPRESSION(S) / ED DIAGNOSES  ? ?Final diagnoses:  ?Symptomatic anemia  ?Hypokalemia  ?Hypoalbuminemia  ? ? ? ?Rx / DC Orders  ? ?ED Discharge Orders   ? ? None  ? ?  ? ? ? ?Note:  This document was prepared using Dragon voice recognition software and may include unintentional dictation errors. ?  ?Tamala Julian,  Ida Rogue, MD ?06/28/21 1432 ? ?

## 2021-06-28 NOTE — Assessment & Plan Note (Addendum)
Due to iron deficiency. Exertional dyspnea improved with 2u PRBCs, hgb up appropriately 5.8 > 8.1g/dl. No active bleeding. Iron stores low.  ?- Give IV iron on day of discharge, continue oral supplementation with vitamin C and augment chronic miralax regimen.  ?- No culprit lesion on EGD, though did have hiatal hernia, gastric polyp. Pt wishes to defer colonoscopy to outpatient setting. D/w Dr. Timothy Lasso who will have office call to arrange follow up on Monday.  ?- Continue protonix 40mg  daily empirically ?- Follow up biopsy results from EGD.  ?- Repeat CBC at follow up and monitor iron stores. ?- Avoid NSAIDs ?

## 2021-06-28 NOTE — Consult Note (Signed)
Inpatient Consultation   Patient ID: Margaret Hansen is a 61 y.o. female.  Requesting Provider: Dr. Joylene Igo  Date of Admission: 06/28/2021  Date of Consult: 06/28/21   Reason for Consultation: Symptomatic anemia, iron deficiency   Patient's Chief Complaint:   Chief Complaint  Patient presents with   Abnormal Lab    61 year old Caucasian female with GERD and depression presenting for generalized weakness and shortness of breath on exertion.  Upon seeing her PCP she was directed to the hospital for hemoglobin of 6.  Gastroenterology is consulted for iron deficiency anemia.   Patient tested in the hospital with hemoglobin of 5.8 MCV of 67.  Patient denies any overt signs of GI bleeding including denying bright red blood per rectum, melena.  No abdominal pain.  Patient had significant hip pain for some time he was taking 800 mg of ibuprofen twice a day for several months.  She began noticing shortness of breath with exertion over the last few weeks.  She denies any chest pain or shortness of breath at rest. She has no weight loss or changes in appetite.  She is on chronic Protonix once daily for acid reflux which is well controlled.  Denies any dysphagia or odynophagia. Receiving 2 u prbc in ED.  Deals with chronic constipation at home for which she uses MiraLAX. No history of liver disease. She is s/p open cholecystectomy and csection x2.  Denies Anti-plt agents, and anticoagulants Mother- colon polyps Denies other family history of gastrointestinal disease and malignancy Previous Endoscopies: Colonoscopy  ~ 10 years ago with diverticulosis- Dr. Matthias Hughs. No h/o egd   Past Medical History:  Diagnosis Date   Depression    GERD (gastroesophageal reflux disease)     Past Surgical History:  Procedure Laterality Date   CESAREAN SECTION     CHOLECYSTECTOMY      No Known Allergies  No family history on file.  Social History   Tobacco Use   Smoking status: Former    Smokeless tobacco: Never  Substance Use Topics   Alcohol use: Not Currently   Drug use: Never     Pertinent GI related history and allergies were reviewed with the patient  Review of Systems  Constitutional:  Positive for fatigue. Negative for activity change, appetite change, chills, diaphoresis, fever and unexpected weight change.  HENT:  Negative for trouble swallowing and voice change.   Respiratory:  Positive for shortness of breath. Negative for wheezing.   Cardiovascular:  Positive for leg swelling. Negative for chest pain and palpitations.  Gastrointestinal:  Negative for abdominal distention, abdominal pain, anal bleeding, blood in stool, constipation, diarrhea, nausea, rectal pain and vomiting.  Musculoskeletal:  Negative for arthralgias and myalgias.  Skin:  Positive for pallor. Negative for color change.  Neurological:  Positive for dizziness, weakness and light-headedness. Negative for syncope.  Psychiatric/Behavioral:  Negative for confusion.   All other systems reviewed and are negative.   Medications Home Medications No current facility-administered medications on file prior to encounter.   Current Outpatient Medications on File Prior to Encounter  Medication Sig Dispense Refill   DULoxetine (CYMBALTA) 30 MG capsule Take 30 mg by mouth daily.     pantoprazole (PROTONIX) 40 MG tablet Take 40 mg by mouth daily.  12   acetaminophen (TYLENOL) 500 MG tablet Take 500-1,000 mg by mouth every 6 (six) hours as needed.     meclizine (ANTIVERT) 25 MG tablet Take 25 mg by mouth every 12 (twelve) hours as needed for  dizziness.     Pertinent GI related medications were reviewed with the patient  Inpatient Medications  Current Facility-Administered Medications:    0.9 %  sodium chloride infusion, 250 mL, Intravenous, PRN, Agbata, Tochukwu, MD   acetaminophen (TYLENOL) tablet 1,000 mg, 1,000 mg, Oral, Q6H PRN, Agbata, Tochukwu, MD   DULoxetine (CYMBALTA) DR capsule 30 mg, 30 mg,  Oral, Daily, Agbata, Tochukwu, MD   meclizine (ANTIVERT) tablet 25 mg, 25 mg, Oral, Q12H PRN, Agbata, Tochukwu, MD   ondansetron (ZOFRAN) tablet 4 mg, 4 mg, Oral, Q6H PRN **OR** ondansetron (ZOFRAN) injection 4 mg, 4 mg, Intravenous, Q6H PRN, Agbata, Tochukwu, MD   pantoprazole (PROTONIX) EC tablet 40 mg, 40 mg, Oral, Daily, Agbata, Tochukwu, MD   potassium chloride SA (KLOR-CON M) CR tablet 40 mEq, 40 mEq, Oral, Once, Gilles Chiquito, MD   sodium chloride flush (NS) 0.9 % injection 3 mL, 3 mL, Intravenous, Q12H, Agbata, Tochukwu, MD   sodium chloride flush (NS) 0.9 % injection 3 mL, 3 mL, Intravenous, PRN, Agbata, Tochukwu, MD  Current Outpatient Medications:    DULoxetine (CYMBALTA) 30 MG capsule, Take 30 mg by mouth daily., Disp: , Rfl:    pantoprazole (PROTONIX) 40 MG tablet, Take 40 mg by mouth daily., Disp: , Rfl: 12   acetaminophen (TYLENOL) 500 MG tablet, Take 500-1,000 mg by mouth every 6 (six) hours as needed., Disp: , Rfl:    meclizine (ANTIVERT) 25 MG tablet, Take 25 mg by mouth every 12 (twelve) hours as needed for dizziness., Disp: , Rfl:   sodium chloride      sodium chloride, acetaminophen, meclizine, ondansetron **OR** ondansetron (ZOFRAN) IV, sodium chloride flush   Objective   Vitals:   06/28/21 1345 06/28/21 1417 06/28/21 1442 06/28/21 1442  BP:  121/63 (!) 109/57 (!) 109/57  Pulse: 72 81 76 76  Resp: (!) 24 20 20    Temp:  99 F (37.2 C) 98.4 F (36.9 C) 98.4 F (36.9 C)  TempSrc:  Oral Oral   SpO2: 99% 100% 99% 99%  Weight:      Height:         Physical Exam Vitals and nursing note reviewed.  Constitutional:      General: She is not in acute distress.    Appearance: Normal appearance. She is obese. She is not ill-appearing, toxic-appearing or diaphoretic.  HENT:     Head: Normocephalic and atraumatic.     Nose: Nose normal.     Mouth/Throat:     Mouth: Mucous membranes are moist.     Pharynx: Oropharynx is clear.  Eyes:     General: No scleral  icterus.    Extraocular Movements: Extraocular movements intact.  Cardiovascular:     Rate and Rhythm: Normal rate and regular rhythm.     Heart sounds: Normal heart sounds. No murmur heard.   No friction rub. No gallop.  Pulmonary:     Effort: Pulmonary effort is normal. No respiratory distress.     Breath sounds: Normal breath sounds. No wheezing, rhonchi or rales.  Abdominal:     General: Bowel sounds are normal. There is no distension.     Palpations: Abdomen is soft.     Tenderness: There is no abdominal tenderness. There is no guarding or rebound.  Musculoskeletal:     Cervical back: Neck supple.     Right lower leg: Edema present.     Left lower leg: Edema present.  Skin:    General: Skin is warm and dry.  Coloration: Skin is pale. Skin is not jaundiced.  Neurological:     General: No focal deficit present.     Mental Status: She is alert and oriented to person, place, and time. Mental status is at baseline.  Psychiatric:        Mood and Affect: Mood normal.        Behavior: Behavior normal.        Thought Content: Thought content normal.        Judgment: Judgment normal.    Laboratory Data Recent Labs  Lab 06/28/21 1026  WBC 7.8  HGB 5.8*  HCT 21.3*  PLT 318   Recent Labs  Lab 06/28/21 1026  NA 143  K 3.3*  CL 110  CO2 28  BUN 20  CALCIUM 8.4*  PROT 6.4*  BILITOT 0.4  ALKPHOS 72  ALT 9  AST 12*  GLUCOSE 123*   No results for input(s): INR in the last 168 hours.  No results for input(s): LIPASE in the last 72 hours.      Imaging Studies: DG Chest 2 View  Result Date: 06/28/2021 CLINICAL DATA:  Shortness of breath EXAM: CHEST - 2 VIEW COMPARISON:  Previous studies including the examination done on 03/15/2019 FINDINGS: Transverse diameter of heart is increased. There are no signs of alveolar pulmonary edema or new focal infiltrates. Fixed hiatal hernia is seen. There is no pleural effusion or pneumothorax. IMPRESSION: Cardiomegaly. There are no  signs of pulmonary edema or focal pulmonary consolidation. Fixed hiatal hernia. Electronically Signed   By: Ernie AvenaPalani  Rathinasamy M.D.   On: 06/28/2021 13:18   DG Chest 2 View  Result Date: 06/28/2021 CLINICAL DATA:  Shortness of breath EXAM: CHEST - 2 VIEW COMPARISON:  06/27/2021 FINDINGS: Transverse diameter of heart is increased. There are no signs of pulmonary edema or new focal infiltrates. There is fixed hiatal hernia in the retrocardiac region. IMPRESSION: There are no signs of pulmonary edema or focal pulmonary consolidation. Hiatal hernia. Electronically Signed   By: Ernie AvenaPalani  Rathinasamy M.D.   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  Transfusion and resuscitation as per primary team Avoid frequent lab draws to prevent lab induced anemia Supportive care and antiemetics as per primary team Maintain two sites IV access Avoid nsaids Monitor for GIB. No overt signs at this time. Do not perform occult blood testing as it is not indicated inpatient and would not change managment  Esophagogastroduodenoscopy with possible biopsy, control of bleeding, polypectomy, and interventions as necessary has been discussed with the patient/patient representative. Informed consent was obtained from the patient/patient representative after  explaining the indication, nature, and risks of the procedure including but not limited to death, bleeding, perforation, missed neoplasm/lesions, cardiorespiratory compromise, and reaction to medications. Opportunity for questions was given and  appropriate answers were provided. Patient/patient representative has verbalized understanding is amenable to undergoing the procedure.    I personally performed the service.  Management of other medical comorbidities as per primary team  Thank you for allowing [place] to participate in this patient's care. Please don't hesitate to call if any questions or concerns arise.   Jaynie Collins, DO [facility]  Portions of the record may have been created with voice recognition software. Occasional wrong-word or 'sound-a-like' substitutions may have occurred due to the inherent limitations of voice recognition software.  Read the chart carefully and recognize, using context, where substitutions may have occurred.

## 2021-06-28 NOTE — Assessment & Plan Note (Addendum)
Stable. Continue cymbalta.  

## 2021-06-28 NOTE — ED Notes (Signed)
Dr. Virgina Jock at bedside, update pt. On POC, updated this RN on POC. ?Pt. Is to be NPO AFTER MIDNIGHT ?

## 2021-06-29 ENCOUNTER — Observation Stay: Payer: Managed Care, Other (non HMO) | Admitting: Anesthesiology

## 2021-06-29 ENCOUNTER — Encounter: Payer: Self-pay | Admitting: Internal Medicine

## 2021-06-29 ENCOUNTER — Encounter
Admission: EM | Disposition: A | Discharge status: Home / Self Care | Payer: Self-pay | Source: Home / Self Care | Attending: Emergency Medicine

## 2021-06-29 DIAGNOSIS — Z6841 Body Mass Index (BMI) 40.0 and over, adult: Secondary | ICD-10-CM | POA: Diagnosis not present

## 2021-06-29 DIAGNOSIS — D649 Anemia, unspecified: Secondary | ICD-10-CM | POA: Diagnosis not present

## 2021-06-29 HISTORY — PX: ESOPHAGOGASTRODUODENOSCOPY (EGD) WITH PROPOFOL: SHX5813

## 2021-06-29 LAB — BASIC METABOLIC PANEL
Anion gap: 5 (ref 5–15)
BUN: 13 mg/dL (ref 8–23)
CO2: 26 mmol/L (ref 22–32)
Calcium: 8.2 mg/dL — ABNORMAL LOW (ref 8.9–10.3)
Chloride: 114 mmol/L — ABNORMAL HIGH (ref 98–111)
Creatinine, Ser: 0.7 mg/dL (ref 0.44–1.00)
GFR, Estimated: >60 mL/min (ref >60)
Glucose, Bld: 101 mg/dL — ABNORMAL HIGH (ref 70–99)
Potassium: 3.9 mmol/L (ref 3.5–5.1)
Sodium: 145 mmol/L (ref 135–145)

## 2021-06-29 LAB — CBC
HCT: 27.4 % — ABNORMAL LOW (ref 36.0–46.0)
Hemoglobin: 8.1 g/dL — ABNORMAL LOW (ref 12.0–15.0)
MCH: 20.9 pg — ABNORMAL LOW (ref 26.0–34.0)
MCHC: 29.6 g/dL — ABNORMAL LOW (ref 30.0–36.0)
MCV: 70.6 fL — ABNORMAL LOW (ref 80.0–100.0)
Platelets: 271 10*3/uL (ref 150–400)
RBC: 3.88 MIL/uL (ref 3.87–5.11)
RDW: 21.7 % — ABNORMAL HIGH (ref 11.5–15.5)
WBC: 7.3 10*3/uL (ref 4.0–10.5)
nRBC: 0 % (ref 0.0–0.2)

## 2021-06-29 SURGERY — ESOPHAGOGASTRODUODENOSCOPY (EGD) WITH PROPOFOL
Anesthesia: General

## 2021-06-29 MED ORDER — POLYETHYLENE GLYCOL 3350 17 GM/SCOOP PO POWD: 17.0000 g | Freq: Every day | ORAL | 0 refills | Status: DC

## 2021-06-29 MED ORDER — LIDOCAINE HCL (CARDIAC) PF 100 MG/5ML IV SOSY
PREFILLED_SYRINGE | INTRAVENOUS | Status: DC | PRN
  Administered 2021-06-29: 100 mg via INTRAVENOUS

## 2021-06-29 MED ORDER — FERROUS SULFATE 325 (65 FE) MG PO TABS: 325.0000 mg | ORAL_TABLET | Freq: Every day | ORAL | 1 refills | Status: AC

## 2021-06-29 MED ORDER — SODIUM CHLORIDE 0.9 % IV SOLN
200.0000 mg | Freq: Once | INTRAVENOUS | Status: AC
  Administered 2021-06-29: 200 mg via INTRAVENOUS
  Filled 2021-06-29: qty 200

## 2021-06-29 MED ORDER — SODIUM CHLORIDE 0.9 % IV SOLN: INTRAVENOUS | Status: DC

## 2021-06-29 MED ORDER — PROPOFOL 500 MG/50ML IV EMUL
INTRAVENOUS | Status: DC | PRN
  Administered 2021-06-29: 120 ug/kg/min via INTRAVENOUS

## 2021-06-29 MED ORDER — PROPOFOL 10 MG/ML IV BOLUS
INTRAVENOUS | Status: DC | PRN
Start: 2021-06-29 — End: 2021-06-29
  Administered 2021-06-29: 70 mg via INTRAVENOUS
  Administered 2021-06-29: 30 mg via INTRAVENOUS

## 2021-06-29 NOTE — H&P (View-Only) (Signed)
Inpatient Follow-up/Progress Note   Patient ID: Margaret Hansen is a 61 y.o. female.  Overnight Events / Subjective Findings NAEON. Pt's sob improved after 2 u prbc. No signs of GIB. No chest pain, n/v. Plan for egd today. Npo since midnight.  No other acute gi complaints.  Review of Systems  Constitutional:  Negative for activity change, appetite change, chills, diaphoresis, fatigue, fever and unexpected weight change.  HENT:  Negative for trouble swallowing and voice change.   Respiratory:  Positive for shortness of breath (improving). Negative for wheezing.   Cardiovascular:  Positive for leg swelling. Negative for chest pain and palpitations.  Gastrointestinal:  Negative for abdominal distention, abdominal pain, anal bleeding, blood in stool, constipation, diarrhea, nausea, rectal pain and vomiting.  Musculoskeletal:  Negative for arthralgias and myalgias.  Skin:  Negative for color change and pallor.  Neurological:  Negative for dizziness, syncope and weakness.  Psychiatric/Behavioral:  Negative for confusion.   All other systems reviewed and are negative.   Medications  Current Facility-Administered Medications:    [MAR Hold] 0.9 %  sodium chloride infusion, 250 mL, Intravenous, PRN, Agbata, Tochukwu, MD   0.9 %  sodium chloride infusion, , Intravenous, Continuous, Annamaria Helling, DO   [facility] Hold] acetaminophen (TYLENOL) tablet 1,000 mg, 1,000 mg, Oral, Q6H PRN, Agbata, Tochukwu, MD   [MAR Hold] DULoxetine (CYMBALTA) DR capsule 30 mg, 30 mg, Oral, Daily, Agbata, Tochukwu, MD   [MAR Hold] iron sucrose (VENOFER) 200 mg in sodium chloride 0.9 % 100 mL IVPB, 200 mg, Intravenous, Once, Patrecia Pour, MD   [MAR Hold] meclizine (ANTIVERT) tablet 25 mg, 25 mg, Oral, Q12H PRN, Agbata, Tochukwu, MD   [MAR Hold] ondansetron (ZOFRAN) tablet 4 mg, 4 mg, Oral, Q6H PRN **OR** [MAR Hold] ondansetron (ZOFRAN) injection 4 mg, 4 mg, Intravenous, Q6H PRN, Agbata, Tochukwu, MD   [MAR Hold]  pantoprazole (PROTONIX) injection 40 mg, 40 mg, Intravenous, Q12H, Agbata, Tochukwu, MD, 40 mg at 06/28/21 2211   [facility] Hold] sodium chloride flush (NS) 0.9 % injection 3 mL, 3 mL, Intravenous, Q12H, Agbata, Tochukwu, MD, 3 mL at 06/28/21 2211   [facility] Hold] sodium chloride flush (NS) 0.9 % injection 3 mL, 3 mL, Intravenous, PRN, Agbata, Tochukwu, MD  [MAR Hold] sodium chloride     sodium chloride     [MAR Hold] iron sucrose      [MAR Hold] sodium chloride, [MAR Hold] acetaminophen, [MAR Hold] meclizine, [MAR Hold] ondansetron **OR** [MAR Hold] ondansetron (ZOFRAN) IV, [MAR Hold] sodium chloride flush   Objective    Vitals:   06/28/21 2149 06/29/21 0011 06/29/21 0545 06/29/21 0845  BP: (!) 108/58 117/69 124/71 126/67  Pulse: 71 83 77 72  Resp: 17 16 16 17   Temp: 98.7 F (37.1 C) 98.2 F (36.8 C) 98.3 F (36.8 C) 98.3 F (36.8 C)  TempSrc: Oral Oral Oral Oral  SpO2: 93% 96% 93% 94%  Weight: 111.1 kg     Height:         Physical Exam Vitals and nursing note reviewed.  Constitutional:      General: She is not in acute distress.    Appearance: Normal appearance. She is obese. She is not ill-appearing, toxic-appearing or diaphoretic.  HENT:     Head: Normocephalic and atraumatic.     Nose: Nose normal.     Mouth/Throat:     Mouth: Mucous membranes are moist.     Pharynx: Oropharynx is clear.  Eyes:     General: No scleral  icterus.    Extraocular Movements: Extraocular movements intact.  Cardiovascular:     Rate and Rhythm: Normal rate and regular rhythm.     Heart sounds: Normal heart sounds. No murmur heard.   No friction rub. No gallop.  Pulmonary:     Effort: Pulmonary effort is normal. No respiratory distress.     Breath sounds: Normal breath sounds. No wheezing, rhonchi or rales.  Abdominal:     General: Bowel sounds are normal. There is no distension.     Palpations: Abdomen is soft.     Tenderness: There is no abdominal tenderness. There is no guarding or rebound.   Musculoskeletal:     Cervical back: Neck supple.     Right lower leg: Edema present.     Left lower leg: Edema present.  Skin:    General: Skin is warm and dry.     Coloration: Skin is not jaundiced or pale.  Neurological:     General: No focal deficit present.     Mental Status: She is alert and oriented to person, place, and time. Mental status is at baseline.  Psychiatric:        Mood and Affect: Mood normal.        Behavior: Behavior normal.        Thought Content: Thought content normal.        Judgment: Judgment normal.     Laboratory Data Recent Labs  Lab 06/28/21 1026 06/29/21 0449  WBC 7.8 7.3  HGB 5.8* 8.1*  HCT 21.3* 27.4*  PLT 318 271  NEUTOPHILPCT 68  --   LYMPHOPCT 23  --   MONOPCT 6  --   EOSPCT 2  --    Recent Labs  Lab 06/28/21 1026 06/29/21 0449  NA 143 145  K 3.3* 3.9  CL 110 114*  CO2 28 26  BUN 20 13  CREATININE 0.73 0.70  CALCIUM 8.4* 8.2*  PROT 6.4*  --   BILITOT 0.4  --   ALKPHOS 72  --   ALT 9  --   AST 12*  --   GLUCOSE 123* 101*   No results for input(s): INR in the last 168 hours.    Imaging Studies: DG Chest 2 View  Result Date: 06/28/2021 CLINICAL DATA:  Shortness of breath EXAM: CHEST - 2 VIEW COMPARISON:  Previous studies including the examination done on 03/15/2019 FINDINGS: Transverse diameter of heart is increased. There are no signs of alveolar pulmonary edema or new focal infiltrates. Fixed hiatal hernia is seen. There is no pleural effusion or pneumothorax. IMPRESSION: Cardiomegaly. There are no signs of pulmonary edema or focal pulmonary consolidation. Fixed hiatal hernia. Electronically Signed   By: Elmer Picker M.D.   On: 06/28/2021 13:18   DG Chest 2 View  Result Date: 06/28/2021 CLINICAL DATA:  Shortness of breath EXAM: CHEST - 2 VIEW COMPARISON:  06/27/2021 FINDINGS: Transverse diameter of heart is increased. There are no signs of pulmonary edema or new focal infiltrates. There is fixed hiatal hernia in the  retrocardiac region. IMPRESSION: There are no signs of pulmonary edema or focal pulmonary consolidation. Hiatal hernia. Electronically Signed   By: Elmer Picker M.D.   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  Transfusion and resuscitation as per primary team Avoid frequent lab draws to prevent lab induced anemia Supportive care and antiemetics as per primary team Maintain two sites IV access Avoid nsaids Monitor for GIB. No overt signs at this time. Do not perform occult blood testing as it is not indicated inpatient and would not change management  In d/w patient, current plan for outpatient colonoscopy Please see post op report from EGD for further recommendations.  Esophagogastroduodenoscopy with possible biopsy, control of bleeding, polypectomy, and interventions as necessary has been discussed with the patient/patient representative. Informed consent was obtained from the patient/patient representative after explaining the indication, nature, and risks of the procedure including but not limited to death, bleeding, perforation, missed neoplasm/lesions, cardiorespiratory compromise, and reaction to medications. Opportunity for questions was given and appropriate answers were provided. Patient/patient representative has verbalized understanding is amenable to undergoing the  procedure.   I personally performed the service.  Management of other medical comorbidities as per primary team  Thank you for allowing [place] to participate in this patient's care. Please don't hesitate to call if any questions or concerns arise.   Annamaria Helling, DO [facility]  Portions of the record may have been created with voice recognition software. Occasional wrong-word or 'sound-a-like' substitutions may have occurred due to the inherent limitations of voice recognition software.  Read the chart carefully and recognize, using context, where substitutions may have occurred.

## 2021-06-29 NOTE — Op Note (Signed)
[facility] ?Gastroenterology ?Patient Name: Margaret Hansen ?Procedure Date: 06/29/2021 9:28 AM ?MRN: [id number] ?Account #: [id number] ?Date of Birth: December 06, 1960 ?Admit Type: Inpatient ?Age: 61 ?Room: [facility] ENDO ROOM 4 ?Gender: Female ?Note Status: Finalized ?Instrument Name: Upper Endoscope [id number] ?Procedure:             Upper GI endoscopy ?Indications:           Iron deficiency anemia ?Providers:             Annamaria Helling DO, DO ?Referring MD:          No Local Md, MD (Referring MD) ?Medicines:             Monitored Anesthesia Care ?Complications:         No immediate complications. Estimated blood loss:  ?                       Minimal. ?Procedure:             Pre-Anesthesia Assessment: ?                       - Prior to the procedure, a History and Physical was  ?                       performed, and patient medications and allergies were  ?                       reviewed. The patient is competent. The risks and  ?                       benefits of the procedure and the sedation options and  ?                       risks were discussed with the patient. All questions  ?                       were answered and informed consent was obtained.  ?                       Patient identification and proposed procedure were  ?                       verified by the physician, the nurse, the anesthetist  ?                       and the technician in the endoscopy suite. Mental  ?                       Status Examination: alert and oriented. Airway  ?                       Examination: normal oropharyngeal airway and neck  ?                       mobility. Respiratory Examination: clear to  ?                       auscultation. CV Examination: RRR, no murmurs, no S3  ?  or S4. Prophylactic Antibiotics: The patient does not  ?                       require prophylactic antibiotics. Prior  ?                       Anticoagulants: The patient has taken no previous  ?                        anticoagulant or antiplatelet agents. ASA Grade  ?                       Assessment: III - A patient with severe systemic  ?                       disease. After reviewing the risks and benefits, the  ?                       patient was deemed in satisfactory condition to  ?                       undergo the procedure. The anesthesia plan was to use  ?                       monitored anesthesia care (MAC). Immediately prior to  ?                       administration of medications, the patient was  ?                       re-assessed for adequacy to receive sedatives. The  ?                       heart rate, respiratory rate, oxygen saturations,  ?                       blood pressure, adequacy of pulmonary ventilation, and  ?                       response to care were monitored throughout the  ?                       procedure. The physical status of the patient was  ?                       re-assessed after the procedure. ?                       After obtaining informed consent, the endoscope was  ?                       passed under direct vision. Throughout the procedure,  ?                       the patient's blood pressure, pulse, and oxygen  ?                       saturations were monitored continuously. The Endoscope  ?  was introduced through the mouth, and advanced to the  ?                       duodenal bulb. The upper GI endoscopy was accomplished  ?                       without difficulty. The patient tolerated the  ?                       procedure well. ?Findings: ?     The duodenal bulb, first portion of the duodenum and second portion of  ?     the duodenum were normal. Biopsies for histology were taken with a cold  ?     forceps for evaluation of celiac disease. Estimated blood loss was  ?     minimal. ?     A 5 cm hiatal hernia was present. Estimated blood loss: none. ?     The entire examined stomach was normal. Biopsies were taken with a cold  ?     forceps for  Helicobacter pylori testing. Estimated blood loss was  ?     minimal. ?     The Z-line was regular. Estimated blood loss: none. ?     Esophagogastric landmarks were identified: the gastroesophageal junction  ?     was found at 30 cm from the incisors. ?     The examined esophagus was normal. ?     Multiple 1 to 3 mm sessile polyps with no bleeding and no stigmata of  ?     recent bleeding were found in the gastric fundus. Estimated blood loss:  ?     none. ?Impression:            - Normal duodenal bulb, first portion of the duodenum  ?                       and second portion of the duodenum. Biopsied. ?                       - 5 cm hiatal hernia. ?                       - Normal stomach. Biopsied. ?                       - Z-line regular. ?                       - Esophagogastric landmarks identified. ?                       - Normal esophagus. ?                       - Multiple gastric polyps. ?Recommendation:        - Discharge patient to home. ?                       - Resume previous diet. ?                       - Continue present medications. ?                       -  Continue once daily ppi (home regimen) ?                       Continue iv iron supplementation. Transition to every  ?                       other day oral therapy upon discharge with vitamin c  ?                       supplementation ?                       - Await pathology results. ?                       - Recommend outpatient colonoscopy (No current gi  ?                       bleeding and patient preference)- will have office  ?                       call patient ?                       - The findings and recommendations were discussed with  ?                       the patient. ?                       - GI to sign off. available as needed ?Procedure Code(s):     --- Professional --- ?                       [phone], Esophagogastroduodenoscopy, flexible,  ?                       transoral; with biopsy, single or multiple ?Diagnosis Code(s):      --- Professional --- ?                       K44.9, Diaphragmatic hernia without obstruction or  ?                       gangrene ?                       D50.9, Iron deficiency anemia, unspecified ?CPT copyright 2019 American [employer] Association. All rights reserved. ?The codes documented in this report are preliminary and upon coder review may  ?be revised to meet current compliance requirements. ?Attending Participation: ?     I personally performed the entire procedure. ?Volney American, DO ?Annamaria Helling DO, DO ?06/29/2021 9:57:26 AM ?This report has been signed electronically. ?Number of Addenda: 0 ?Note Initiated On: 06/29/2021 9:28 AM ?Estimated Blood Loss:  Estimated blood loss was minimal. ?     [facility] ?

## 2021-06-29 NOTE — Discharge Summary (Signed)
Physician Discharge Summary   Patient: Margaret Hansen MRN: [id number] DOB: Jul 15, 1960  Admit date:     06/28/2021  Discharge date: 06/29/21  Discharge Physician: Tyrone Nine   PCP: Irven Coe, MD   Recommendations at discharge:  - Follow up GI after discharge (pt will be contacted) for colonoscopy.  - Repeat CBC and iron studies at follow up. Given IV iron and started oral supplementation. Hgb 8.1g/dl at discharge with improved symptoms and no active bleeding.  Discharge Diagnoses: Principal Problem:   Symptomatic anemia Active Problems:   Depression   Morbid obesity with BMI of 40.0-44.9, adult [facility])  Hospital Course: Margaret Hansen is a 61 y.o. female with medical history significant for depression, GERD, morbid obesity who presents to the ER at the request of her PCP for evaluation of anemia. Patient states that over the last couple of weeks she has had exertional shortness of breath and has felt fatigued.  She has had intermittent dizziness that she attributed to her history of vertigo.  She denies having any chest pain, no loss of consciousness, no fall, no hematuria, no hematochezia, no melena or hematemesis. She admits to NSAID use about a couple of months ago for chronic right hip pain and states that at the time she took about 800 mg twice daily but has cut back because her pain has improved. She denies having any nausea, no vomiting, no urinary symptoms, no fever, no chills, no cough, no palpitations, no diaphoresis, no focal deficits or blurred vision. She was contacted by her PCP and told her hemoglobin was 6 and was advised to come to the ER for further evaluation.  Anemia and symptoms improved with transfusions. Pt was also given IV iron. EGD was performed without culprit lesion identified and the patient wishes to continue work up as outpatient.   Assessment and Plan: * Symptomatic anemia Due to iron deficiency. Exertional dyspnea improved with 2u PRBCs, hgb up  appropriately 5.8 > 8.1g/dl. No active bleeding. Iron stores low.  - Give IV iron on day of discharge, continue oral supplementation with vitamin C and augment chronic miralax regimen.  - No culprit lesion on EGD, though did have hiatal hernia, gastric polyp. Pt wishes to defer colonoscopy to outpatient setting. D/w Dr. Timothy Lasso who will have office call to arrange follow up on Monday.  - Continue protonix 40mg  daily empirically - Follow up biopsy results from EGD.  - Repeat CBC at follow up and monitor iron stores. - Avoid NSAIDs  Morbid obesity with BMI of 40.0-44.9, adult (HCC) - Weight loss recommended  Depression Stable.  - Continue cymbalta   Impression:            - Normal duodenal bulb, first portion of the duodenum                         and second portion of the duodenum. Biopsied.                        - 5 cm hiatal hernia.                        - Normal stomach. Biopsied.                        - Z-line regular.                        -  Esophagogastric landmarks identified.                        - Normal esophagus.                        - Multiple gastric polyps. Recommendation:        - Discharge patient to home.                        - Resume previous diet.                        - Continue present medications.                        - Continue once daily ppi (home regimen)                        Continue iv iron supplementation. Transition to every                         other day oral therapy upon discharge with vitamin c                         supplementation                        - Await pathology results.                        - Recommend outpatient colonoscopy (No current gi                         bleeding and patient preference)- will have office                         call patient                        - The findings and recommendations were discussed with                         the patient.                        - GI to sign off. available as  needed  Consultants: GI Procedures performed: EGD 06/29/2021 Dr. Timothy Lassousso (results above)  Disposition: Home Diet recommendation:  Cardiac and Carb modified diet  DISCHARGE MEDICATION: Allergies as of 06/29/2021   No Known Allergies      Medication List     TAKE these medications    acetaminophen 500 MG tablet Commonly known as: TYLENOL Take 500-1,000 mg by mouth every 6 (six) hours as needed.   DULoxetine 30 MG capsule Commonly known as: CYMBALTA Take 30 mg by mouth daily.   ferrous sulfate 325 (65 FE) MG tablet Take 1 tablet (325 mg total) by mouth daily with breakfast.   meclizine 25 MG tablet Commonly known as: ANTIVERT Take 25 mg by mouth every 12 (twelve) hours as needed for dizziness.   pantoprazole 40 MG tablet Commonly known as: PROTONIX Take 40 mg by mouth daily.   polyethylene glycol powder 17 GM/SCOOP powder Commonly known as:  GLYCOLAX/MIRALAX Take 17 g by mouth daily. Take as needed to produce 1 normal bowel movement per day.        Follow-up Information     Irven Coe, MD. Schedule an appointment as soon as possible for a visit in 2 week(s).   Specialty: Family Medicine Contact information: [address] [place] [address] [phone]         Toney Reil, MD. Call.   Specialty: Gastroenterology Why: Call if you are not contacted. Contact information: [address] [place] [address] [phone]               Subjective: Margaret Hansen around unit with less shortness of breath. Continues to have no gross bleeding, abd pain, N/V/D. Wants to go home. Was taking significant amount of NSAIDs PTA.  Discharge Exam: BP 130/67 (BP Location: Left Arm)    Pulse 73    Temp 98.4 F (36.9 C) (Oral)    Resp 17    Ht 4\' 11"  (1.499 m)    Wt 111.1 kg    SpO2 95%    BMI 49.48 kg/m   Gen: Well-appearing in no distress Pulm: Clear, nonlabored CV: RRR no murmur or pitting edema GI: Soft, NT, ND Neuro: Alert, oriented,  nonfocal   Condition at discharge: stable  The results of significant diagnostics from this hospitalization (including imaging, microbiology, ancillary and laboratory) are listed below for reference.   Imaging Studies: DG Chest 2 View  Result Date: 06/28/2021 CLINICAL DATA:  Shortness of breath EXAM: CHEST - 2 VIEW COMPARISON:  Previous studies including the examination done on 03/15/2019 FINDINGS: Transverse diameter of heart is increased. There are no signs of alveolar pulmonary edema or new focal infiltrates. Fixed hiatal hernia is seen. There is no pleural effusion or pneumothorax. IMPRESSION: Cardiomegaly. There are no signs of pulmonary edema or focal pulmonary consolidation. Fixed hiatal hernia. Electronically Signed   By: 03/17/2019 M.D.   On: 06/28/2021 13:18   DG Chest 2 View  Result Date: 06/28/2021 CLINICAL DATA:  Shortness of breath EXAM: CHEST - 2 VIEW COMPARISON:  06/27/2021 FINDINGS: Transverse diameter of heart is increased. There are no signs of pulmonary edema or new focal infiltrates. There is fixed hiatal hernia in the retrocardiac region. IMPRESSION: There are no signs of pulmonary edema or focal pulmonary consolidation. Hiatal hernia. Electronically Signed   By: 08/27/2021 M.D.   On: 06/28/2021 13:16    Microbiology: Results for orders placed or performed during the hospital encounter of 06/28/21  Resp Panel by RT-PCR (Flu A&B, Covid) Nasopharyngeal Swab     Status: None   Collection Time: 06/28/21 12:01 PM   Specimen: Nasopharyngeal Swab; Nasopharyngeal(NP) swabs in vial transport medium  Result Value Ref Range Status   SARS Coronavirus 2 by RT PCR NEGATIVE NEGATIVE Final    Comment: (NOTE) SARS-CoV-2 target nucleic acids are NOT DETECTED.  The SARS-CoV-2 RNA is generally detectable in upper respiratory specimens during the acute phase of infection. The lowest concentration of SARS-CoV-2 viral copies this assay can detect is 138 copies/mL. A  negative result does not preclude SARS-Cov-2 infection and should not be used as the sole basis for treatment or other patient management decisions. A negative result may occur with  improper specimen collection/handling, submission of specimen other than nasopharyngeal swab, presence of viral mutation(s) within the areas targeted by this assay, and inadequate number of viral copies(<138 copies/mL). A negative result must be combined with clinical observations, patient history,  and epidemiological information. The expected result is Negative.  Fact Sheet for Patients:  BloggerCourse.com  Fact Sheet for Healthcare Providers:  SeriousBroker.it  This test is no t yet approved or cleared by the [place] FDA and  has been authorized for detection and/or diagnosis of SARS-CoV-2 by FDA under an Emergency Use Authorization (EUA). This EUA will remain  in effect (meaning this test can be used) for the duration of the COVID-19 declaration under Section 564(b)(1) of the Act, 21 U.S.C.section 360bbb-3(b)(1), unless the authorization is terminated  or revoked sooner.       Influenza A by PCR NEGATIVE NEGATIVE Final   Influenza B by PCR NEGATIVE NEGATIVE Final    Comment: (NOTE) The Xpert Xpress SARS-CoV-2/FLU/RSV plus assay is intended as an aid in the diagnosis of influenza from Nasopharyngeal swab specimens and should not be used as a sole basis for treatment. Nasal washings and aspirates are unacceptable for Xpert Xpress SARS-CoV-2/FLU/RSV testing.  Fact Sheet for Patients: BloggerCourse.com  Fact Sheet for Healthcare Providers: SeriousBroker.it  This test is not yet approved or cleared by the [place] FDA and has been authorized for detection and/or diagnosis of SARS-CoV-2 by FDA under an Emergency Use Authorization (EUA). This EUA will remain in effect (meaning this test can  be used) for the duration of the COVID-19 declaration under Section 564(b)(1) of the Act, 21 U.S.C. section 360bbb-3(b)(1), unless the authorization is terminated or revoked.  Performed at [facility], [address] [place], [place] [address]     Labs: CBC: Recent Labs  Lab 06/28/21 1026 06/29/21 0449  WBC 7.8 7.3  NEUTROABS 5.3  --   HGB 5.8* 8.1*  HCT 21.3* 27.4*  MCV 66.8* 70.6*  PLT 318 271   Basic Metabolic Panel: Recent Labs  Lab 06/28/21 1026 06/29/21 0449  NA 143 145  K 3.3* 3.9  CL 110 114*  CO2 28 26  GLUCOSE 123* 101*  BUN 20 13  CREATININE 0.73 0.70  CALCIUM 8.4* 8.2*   Liver Function Tests: Recent Labs  Lab 06/28/21 1026  AST 12*  ALT 9  ALKPHOS 72  BILITOT 0.4  PROT 6.4*  ALBUMIN 2.9*   CBG: No results for input(s): GLUCAP in the last 168 hours.  Discharge time spent: greater than 30 minutes.  Signed: Tyrone Nine, MD [facility] 06/30/2021

## 2021-06-29 NOTE — Transfer of Care (Signed)
Immediate Anesthesia Transfer of Care Note ? ?Patient: Margaret Hansen ? ?Procedure(s) Performed: ESOPHAGOGASTRODUODENOSCOPY (EGD) WITH PROPOFOL ? ?Patient Location: PACU ? ?Anesthesia Type:General ? ?Level of Consciousness: awake, alert  and oriented ? ?Airway & Oxygen Therapy: Patient Spontanous Breathing ? ?Post-op Assessment: Report given to RN and Post -op Vital signs reviewed and stable ? ?Post vital signs: Reviewed and stable ? ?Last Vitals:  ?Vitals Value Taken Time  ?BP 120/76   ?Temp    ?Pulse 81 06/29/21 1000  ?Resp 21 06/29/21 1000  ?SpO2 93 % 06/29/21 1000  ?Vitals shown include unvalidated device data. ? ?Last Pain:  ?Vitals:  ? 06/29/21 0845  ?TempSrc: Oral  ?PainSc:   ?   ? ?  ? ?Complications: No notable events documented. ?
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
## 2021-06-29 NOTE — Anesthesia Postprocedure Evaluation (Signed)
Anesthesia Post Note ? ?Patient: AKYLA VAVREK ? ?Procedure(s) Performed: ESOPHAGOGASTRODUODENOSCOPY (EGD) WITH PROPOFOL ? ?Patient location during evaluation: Endoscopy ?Anesthesia Type: General ?Level of consciousness: awake and alert ?Pain management: pain level controlled ?Vital Signs Assessment: post-procedure vital signs reviewed and stable ?Respiratory status: spontaneous breathing, nonlabored ventilation, respiratory function stable and patient connected to nasal cannula oxygen ?Cardiovascular status: blood pressure returned to baseline and stable ?Postop Assessment: no apparent nausea or vomiting ?Anesthetic complications: no ? ? ?No notable events documented. ? ? ?Last Vitals:  ?Vitals:  ? 06/29/21 1020 06/29/21 1206  ?BP: 114/77 130/67  ?Pulse: 77 73  ?Resp: 16 17  ?Temp:  36.9 ?C  ?SpO2: 94% 95%  ?  ?Last Pain:  ?Vitals:  ? 06/29/21 1206  ?TempSrc: Oral  ?PainSc: 0-No pain  ? ? ?  ?  ?  ?  ?  ?  ? ?Lenard Simmer ? ? ? ? ?

## 2021-06-29 NOTE — Interval H&P Note (Signed)
History and Physical Interval Note: Progress Note from 06/29/21 ? was reviewed and there was no interval change after seeing and examining the patient.  Written consent was obtained from the patient after discussion of risks, benefits, and alternatives. Patient has consented to proceed with Esophagogastroduodenoscopy with possible intervention ? ? ?06/29/2021 ?9:37 AM ? ?Margaret Hansen  has presented today for surgery, with the diagnosis of symptomatic anemia, iron deficiency.  The various methods of treatment have been discussed with the patient and family. After consideration of risks, benefits and other options for treatment, the patient has consented to  Procedure(s): ?ESOPHAGOGASTRODUODENOSCOPY (EGD) WITH PROPOFOL (N/A) as a surgical intervention.  The patient's history has been reviewed, patient examined, no change in status, stable for surgery.  I have reviewed the patient's chart and labs.  Questions were answered to the patient's satisfaction.   ? ? ?Margaret Hansen ? ? ?

## 2021-06-29 NOTE — Anesthesia Preprocedure Evaluation (Signed)
Anesthesia Evaluation  ?Patient identified by MRN, date of birth, ID band ?Patient awake ? ? ? ?Reviewed: ?Allergy & Precautions, H&P , NPO status , Patient's Chart, lab work & pertinent test results, reviewed documented beta blocker date and time  ? ?History of Anesthesia Complications ?Negative for: history of anesthetic complications ? ?Airway ?Mallampati: IV ? ?TM Distance: >3 FB ?Neck ROM: full ? ?Mouth opening: Limited Mouth Opening ? Dental ? ?(+) Dental Advidsory Given, Caps, Missing, Teeth Intact ?Permanent retainer on the bottom:   ?Pulmonary ?neg shortness of breath, sleep apnea and Continuous Positive Airway Pressure Ventilation , neg COPD, neg recent URI, former smoker,  ?  ?Pulmonary exam normal ?breath sounds clear to auscultation ? ? ? ? ? ? Cardiovascular ?Exercise Tolerance: Good ?negative cardio ROS ?Normal cardiovascular exam ?Rhythm:regular Rate:Normal ? ? ?  ?Neuro/Psych ?PSYCHIATRIC DISORDERS Depression negative neurological ROS ?   ? GI/Hepatic ?Neg liver ROS, GERD  ,  ?Endo/Other  ?neg diabetesMorbid obesity ? Renal/GU ?negative Renal ROS  ?negative genitourinary ?  ?Musculoskeletal ? ? Abdominal ?  ?Peds ? Hematology ?negative hematology ROS ?(+)   ?Anesthesia Other Findings ?Past Medical History: ?No date: Depression ?No date: GERD (gastroesophageal reflux disease) ? ? Reproductive/Obstetrics ?negative OB ROS ? ?  ? ? ? ? ? ? ? ? ? ? ? ? ? ?  ?  ? ? ? ? ? ? ? ? ?Anesthesia Physical ?Anesthesia Plan ? ?ASA: 3 ? ?Anesthesia Plan: General  ? ?Post-op Pain Management:   ? ?Induction: Intravenous ? ?PONV Risk Score and Plan: 3 and Propofol infusion and TIVA ? ?Airway Management Planned: Natural Airway, Nasal CPAP, Nasal Cannula and Simple Face Mask ? ?Additional Equipment:  ? ?Intra-op Plan:  ? ?Post-operative Plan:  ? ?Informed Consent: I have reviewed the patients History and Physical, chart, labs and discussed the procedure including the risks, benefits and  alternatives for the proposed anesthesia with the patient or authorized representative who has indicated his/her understanding and acceptance.  ? ? ? ?Dental Advisory Given ? ?Plan Discussed with: Anesthesiologist, CRNA and Surgeon ? ?Anesthesia Plan Comments:   ? ? ? ? ? ? ?Anesthesia Quick Evaluation ? ?

## 2021-06-30 LAB — TYPE AND SCREEN
ABO/RH(D): O NEG
Antibody Screen: NEGATIVE
Unit division: 0
Unit division: 0

## 2021-06-30 LAB — BPAM RBC
Blood Product Expiration Date: 202304052359
Blood Product Expiration Date: 202304052359
ISSUE DATE / TIME: 202303031413
ISSUE DATE / TIME: 202303031809
Unit Type and Rh: 5100
Unit Type and Rh: 5100

## 2021-07-01 ENCOUNTER — Encounter: Payer: Self-pay | Admitting: Gastroenterology

## 2021-07-02 ENCOUNTER — Telehealth: Payer: Self-pay

## 2021-07-02 LAB — SURGICAL PATHOLOGY

## 2021-07-02 NOTE — Telephone Encounter (Signed)
Patient wants to contact her gi provider she had in the past in the [facility] because she is more familiar with them and was confused on why she needed  to call us when she wanted to go there  ?

## 2021-09-10 ENCOUNTER — Emergency Department (HOSPITAL_COMMUNITY): Payer: Managed Care, Other (non HMO)

## 2021-09-10 ENCOUNTER — Encounter (HOSPITAL_COMMUNITY): Payer: Self-pay | Admitting: Emergency Medicine

## 2021-09-10 ENCOUNTER — Emergency Department (HOSPITAL_COMMUNITY)
Admission: EM | Admit: 2021-09-10 | Discharge: 2021-09-10 | Disposition: A | Discharge status: Home / Self Care | Payer: Managed Care, Other (non HMO) | Attending: Emergency Medicine | Admitting: Emergency Medicine

## 2021-09-10 ENCOUNTER — Other Ambulatory Visit: Payer: Self-pay

## 2021-09-10 DIAGNOSIS — Z79899 Other long term (current) drug therapy: Secondary | ICD-10-CM | POA: Insufficient documentation

## 2021-09-10 DIAGNOSIS — R21 Rash and other nonspecific skin eruption: Secondary | ICD-10-CM | POA: Insufficient documentation

## 2021-09-10 DIAGNOSIS — R531 Weakness: Secondary | ICD-10-CM | POA: Insufficient documentation

## 2021-09-10 DIAGNOSIS — R002 Palpitations: Secondary | ICD-10-CM | POA: Diagnosis present

## 2021-09-10 DIAGNOSIS — I4891 Unspecified atrial fibrillation: Secondary | ICD-10-CM | POA: Insufficient documentation

## 2021-09-10 LAB — BASIC METABOLIC PANEL
Anion gap: 8 (ref 5–15)
BUN: 10 mg/dL (ref 8–23)
CO2: 26 mmol/L (ref 22–32)
Calcium: 8.5 mg/dL — ABNORMAL LOW (ref 8.9–10.3)
Chloride: 111 mmol/L (ref 98–111)
Creatinine, Ser: 0.77 mg/dL (ref 0.44–1.00)
GFR, Estimated: >60 mL/min (ref >60)
Glucose, Bld: 92 mg/dL (ref 70–99)
Potassium: 3.9 mmol/L (ref 3.5–5.1)
Sodium: 145 mmol/L (ref 135–145)

## 2021-09-10 LAB — TSH: TSH: 3.362 u[IU]/mL (ref 0.350–4.500)

## 2021-09-10 LAB — CBC
HCT: 39.6 % (ref 36.0–46.0)
Hemoglobin: 11.7 g/dL — ABNORMAL LOW (ref 12.0–15.0)
MCH: 23.3 pg — ABNORMAL LOW (ref 26.0–34.0)
MCHC: 29.5 g/dL — ABNORMAL LOW (ref 30.0–36.0)
MCV: 78.7 fL — ABNORMAL LOW (ref 80.0–100.0)
Platelets: 321 10*3/uL (ref 150–400)
RBC: 5.03 MIL/uL (ref 3.87–5.11)
RDW: 19.9 % — ABNORMAL HIGH (ref 11.5–15.5)
WBC: 8.8 10*3/uL (ref 4.0–10.5)
nRBC: 0 % (ref 0.0–0.2)

## 2021-09-10 LAB — MAGNESIUM: Magnesium: 2 mg/dL (ref 1.7–2.4)

## 2021-09-10 MED ORDER — SODIUM CHLORIDE 0.9 % IV BOLUS
500.0000 mL | Freq: Once | INTRAVENOUS | Status: AC
  Administered 2021-09-10: 500 mL via INTRAVENOUS

## 2021-09-10 MED ORDER — TRIAMCINOLONE ACETONIDE 0.1 % EX CREA: 1.0000 "application " | TOPICAL_CREAM | Freq: Two times a day (BID) | CUTANEOUS | 0 refills | Status: DC

## 2021-09-10 NOTE — ED Triage Notes (Signed)
Patient states had a colonoscopy this afternoon. Reports her hear rate was a little fast before the procedure and after and was told to follow up at ED. Denies any chest pain or shortness of breath.   ?

## 2021-09-10 NOTE — Discharge Instructions (Addendum)
A referral has been sent to the a fib clinic for follow up. Return to the ER for any worsening or concerning symptoms. ?Recheck with your doctor. ?Can try triamcinolone for the rash (do not use additional topical or oral steroids while using triamcinolone).  ?

## 2021-09-10 NOTE — ED Provider Triage Note (Signed)
Emergency Medicine Provider Triage Evaluation Note ? ?Margaret Hansen , a 61 y.o. female  was evaluated in triage.  Pt complains of a twinge of chest pain yesterday and overall palpitations and weakness.  No Hx of A-fib or thyroid disease.  Not on anticoagulation.  Recently had colonoscopy today.  Hx of blood transfusion a few months ago due to an incidental finding of hemoglobin of 6-7.  Concerned about her hemoglobin level today. ? ?Review of Systems  ?Positive: As above ?Negative: As above ? ?Physical Exam  ?BP (!) 125/112 (BP Location: Right Arm)   Pulse 75   Temp 99.4 ?F (37.4 ?C) (Oral)   Resp 18   Ht 4\' 11"  (1.499 m)   Wt 111.1 kg   SpO2 97%   BMI 49.48 kg/m?  ?Gen:   Awake, no distress   ?Resp:  Normal effort, CTAB ?MSK:   Moves extremities without difficulty  ?Other:  Abdomen soft nontender.  Audible murmur.  Irregularly irregular rhythm.  Tachycardic.  No lower extremity edema. ? ?Medical Decision Making  ?Medically screening exam initiated at 3:33 PM.  Appropriate orders placed.  Terrin Imparato Lothrop was informed that the remainder of the evaluation will be completed by another provider, this initial triage assessment does not replace that evaluation, and the importance of remaining in the ED until their evaluation is complete. ? ?Labs, imaging, EKG ordered. ? ?Discussed with triage nurse, patient will be getting a room in the back as soon as possible. ?  ?Brandy Hale, PA-C ?09/10/21 1545 ? ?

## 2021-09-10 NOTE — ED Provider Notes (Signed)
?[facility] ?Provider Note ? ? ?CSN: [id number] ?Arrival date & time: 09/10/21  1500 ? ?  ? ?History ? ?Chief Complaint  ?Patient presents with  ? Palpitations  ? ? ?Margaret Hansen is a 61 y.o. female. ? ?61 year old female with history of GERD and depression, recent admission for anemia (hgb 6, transfused, EGD without source), presents with concern for elevated heart rate at her outpatient colonoscopy today. Patient reports feeling her usual self without complaint of palpitations, weakness, CP or SHOB, was told her HR was elevated prior to her scope, was given a "short acting medication to lower her heart rate" during her procedure but HR remained elevated afterwards and she was sent to the ER for an EKG. Patient arrived in the emergency room with concern for A-fib with rate of 147 on her EKG, no prior history of A-fib.  Upon arrival in the room and placed on the monitor, found to be in sinus rhythm with a rate in the 90s. ?States she has had a rash for about a month that is itchy, located trunk and extremities, improves with cortisone and zyrtec but returns if she stops meds. No new meds, did have prior transfusion but does not think related.  ? ? ?  ? ?Home Medications ?Prior to Admission medications   ?Medication Sig Start Date End Date Taking? Authorizing Provider  ?triamcinolone cream (KENALOG) 0.1 % Apply 1 application. topically 2 (two) times daily. 09/10/21  Yes Tacy Learn, PA-C  ?acetaminophen (TYLENOL) 500 MG tablet Take 500-1,000 mg by mouth every 6 (six) hours as needed.    [provider]  ?DULoxetine (CYMBALTA) 30 MG capsule Take 30 mg by mouth daily. 06/17/21   [provider]  ?ferrous sulfate 325 (65 FE) MG tablet Take 1 tablet (325 mg total) by mouth daily with breakfast. 06/29/21   Patrecia Pour, MD  ?meclizine (ANTIVERT) 25 MG tablet Take 25 mg by mouth every 12 (twelve) hours as needed for dizziness. 05/23/21   [provider]   ?pantoprazole (PROTONIX) 40 MG tablet Take 40 mg by mouth daily. 01/12/16   [provider]  ?polyethylene glycol powder (GLYCOLAX/MIRALAX) 17 GM/SCOOP powder Take 17 g by mouth daily. Take as needed to produce 1 normal bowel movement per day. 06/29/21   Patrecia Pour, MD  ?   ? ?Allergies    ?Patient has no known allergies.   ? ?Review of Systems   ?Review of Systems ?Negative except as per HPI ?Physical Exam ?Updated Vital Signs ?BP 90/68   Pulse 86   Temp 98.4 ?F (36.9 ?C) (Oral)   Resp 13   Ht 4\' 11"  (1.499 m)   Wt 111.1 kg   SpO2 97%   BMI 49.48 kg/m?  ?Physical Exam ?Vitals and nursing note reviewed.  ?Constitutional:   ?   General: She is not in acute distress. ?   Appearance: She is well-developed. She is not diaphoretic.  ?HENT:  ?   Head: Normocephalic and atraumatic.  ?Cardiovascular:  ?   Rate and Rhythm: Normal rate and regular rhythm.  ?   Pulses: Normal pulses.  ?   Heart sounds: Normal heart sounds.  ?Pulmonary:  ?   Effort: Pulmonary effort is normal.  ?   Breath sounds: Normal breath sounds.  ?Abdominal:  ?   Palpations: Abdomen is soft.  ?   Tenderness: There is no abdominal tenderness.  ?Musculoskeletal:  ?   Right lower leg: No  edema.  ?   Left lower leg: No edema.  ?Skin: ?   General: Skin is warm and dry.  ?   Findings: Rash present. No erythema.  ?Neurological:  ?   Mental Status: She is alert and oriented to person, place, and time.  ?Psychiatric:     ?   Behavior: Behavior normal.  ? ? ?ED Results / Procedures / Treatments   ?Labs ?(all labs ordered are listed, but only abnormal results are displayed) ?Labs Reviewed  ?BASIC METABOLIC PANEL - Abnormal; Notable for the following components:  ?    Result Value  ? Calcium 8.5 (*)   ? All other components within normal limits  ?CBC - Abnormal; Notable for the following components:  ? Hemoglobin 11.7 (*)   ? MCV 78.7 (*)   ? MCH 23.3 (*)   ? MCHC 29.5 (*)   ? RDW 19.9 (*)   ? All other components within normal limits  ?MAGNESIUM   ?TSH  ? ? ?EKG ?EKG Interpretation ? ?Date/Time:  Tuesday Sep 10 2021 15:10:08 EDT ?Ventricular Rate:  147 ?PR Interval:    ?QRS Duration: 88 ?QT Interval:  258 ?QTC Calculation: 403 ?R Axis:   98 ?Text Interpretation: Atrial fibrillation with rapid ventricular response Rightward axis ST & T wave abnormality, consider inferior ischemia Abnormal ECG When compared with ECG of 28-Jun-2021 11:59, PREVIOUS ECG IS PRESENT Confirmed by Regan Lemming ([id number]) on 09/10/2021 5:39:20 PM ? ?Radiology ?DG Chest Port 1 View ? ?Result Date: 09/10/2021 ?CLINICAL DATA:  Atrial fibrillation. EXAM: PORTABLE CHEST 1 VIEW COMPARISON:  06/28/2021 FINDINGS: Numerous leads and wires project over the chest. Midline trachea. Borderline cardiomegaly. Moderate hiatal hernia. No pleural effusion or pneumothorax. No congestive failure. Clear lungs. IMPRESSION: Borderline cardiomegaly, without acute disease. Hiatal hernia. Electronically Signed   By: Abigail Miyamoto M.D.   On: 09/10/2021 16:09   ? ?Procedures ?Marland KitchenCritical Care ?Performed by: Tacy Learn, PA-C ?Authorized by: Tacy Learn, PA-C  ? ?Critical care provider statement:  ?  Critical care time (minutes):  30 ?  Critical care was time spent personally by me on the following activities:  Development of treatment plan with patient or surrogate, discussions with consultants, evaluation of patient's response to treatment, examination of patient, ordering and review of laboratory studies, ordering and review of radiographic studies, ordering and performing treatments and interventions, pulse oximetry, re-evaluation of patient's condition and review of old charts  ? ? ?Medications Ordered in ED ?Medications  ?sodium chloride 0.9 % bolus 500 mL (0 mLs Intravenous Stopped 09/10/21 1758)  ? ? ?ED Course/ Medical Decision Making/ A&P ?  ?    CHA2DS2-VASc Score: 1 ?                   ?Medical Decision Making ?Risk ?Prescription drug management. ? ? ?This patient presents to the ED for concern of  elevated heart rate at colonoscopy today, this involves an extensive number of treatment options, and is a complaint that carries with it a high risk of complications and morbidity.  The differential diagnosis includes but not limited to arrhythmia, dehydration, electrolyte disturbance  ? ? ?Co morbidities that complicate the patient evaluation ? ?Anemia, depression  ? ? ?Additional history obtained: ? ?External records from outside source obtained and reviewed including recent transfusion for hgb 5.8, improved and stable post transfusion  ? ? ?Lab Tests: ? ?I Ordered, and personally interpreted labs.  The pertinent results include:  cbc, bmp, magnesium, tsh, without significant  findings.  ? ? ?Imaging Studies ordered: ? ?I ordered imaging studies including CXR  ?I independently visualized and interpreted imaging which showed no acute disease  ?I agree with the radiologist interpretation ? ? ?Cardiac Monitoring: / EKG: ? ?The patient was maintained on a cardiac monitor.  I personally viewed and interpreted the cardiac monitored which showed an underlying rhythm of: sinus rhythm with rate in 90s ? ? ?Problem List / ED Course / Critical interventions / Medication management ? ?61 year old female presents with elevated HR at OP colonoscopy center today. Given unknown medication to lower HR without improvement, also IVF at center. Arrives with EKG showing a fib RVR with rate in 140s. Patient denies related complaints. She converted to NSR once in the room and remained in NSR and without any complaints during her work up. Suspect her prep may have resulted in the arrhythmia that resolved with fluid resuscitation. Referred to a fib clinic for further work up with ER precautions given. CHA2DS2-VASC score 1, does not require further treatment at this time.  ?Will give triamcinolone for her rash.  ?I ordered medication including IVF  for arrhythmia   ?Reevaluation of the patient after these medicines showed that the patient  stayed the same/NSR ?I have reviewed the patients home medicines and have made adjustments as needed ? ? ?Social Determinants of Health: ? ?Has PCP for follow up care ? ? ?Test / Admission - Considered: ? ?Consider a

## 2021-09-17 ENCOUNTER — Ambulatory Visit (HOSPITAL_COMMUNITY)
Admission: RE | Admit: 2021-09-17 | Discharge: 2021-09-17 | Disposition: A | Discharge status: Home / Self Care | Payer: Managed Care, Other (non HMO) | Source: Ambulatory Visit | Attending: Physician Assistant | Admitting: Physician Assistant

## 2021-09-17 ENCOUNTER — Encounter (HOSPITAL_COMMUNITY): Payer: Self-pay | Admitting: Physician Assistant

## 2021-09-17 VITALS — BP 124/78 | HR 68 | Ht 59.0 in | Wt 231.2 lb

## 2021-09-17 DIAGNOSIS — G4733 Obstructive sleep apnea (adult) (pediatric): Secondary | ICD-10-CM | POA: Insufficient documentation

## 2021-09-17 DIAGNOSIS — E669 Obesity, unspecified: Secondary | ICD-10-CM | POA: Diagnosis not present

## 2021-09-17 DIAGNOSIS — D649 Anemia, unspecified: Secondary | ICD-10-CM | POA: Diagnosis not present

## 2021-09-17 DIAGNOSIS — I480 Paroxysmal atrial fibrillation: Secondary | ICD-10-CM

## 2021-09-17 DIAGNOSIS — Z6841 Body Mass Index (BMI) 40.0 and over, adult: Secondary | ICD-10-CM | POA: Diagnosis not present

## 2021-09-17 DIAGNOSIS — R011 Cardiac murmur, unspecified: Secondary | ICD-10-CM | POA: Insufficient documentation

## 2021-09-17 NOTE — Progress Notes (Signed)
Primary Care Physician: Irven Coe, MD Primary Cardiologist: none Primary Electrophysiologist: none Referring Physician: [facility]   Margaret Hansen is a 61 y.o. female with a history of anemia s/p transfusion 06/28/21, OSA, atrial fibrillation who presents for consultation in the [facility]. Patient was admitted for anemia with Hgb 5.8 on 06/28/21. She was transfused two units of PRBC and had EGD which did not show a clear source. She went for her outpatient colonoscopy 09/10/21. She was told her HR was elevated prior to her scope, was given a "short acting medication to lower her heart rate" during her procedure but HR remained elevated afterwards and she was sent to the ER for an EKG. Patient arrived in the emergency room with concern for A-fib with rate of 147 on her EKG, no prior history of A-fib.  Upon arrival in the room and placed on the monitor, found to be in sinus rhythm with a rate in the 90s. Patient has a CHADS2VASC score of 1. She was asymptomatic during her arrhythmia. Today, patient reports that she feels well. She denies significant alcohol use and is compliant with her CPAP.  Today, she denies symptoms of palpitations, chest pain, shortness of breath, orthopnea, PND, lower extremity edema, dizziness, presyncope, syncope, snoring, daytime somnolence, bleeding, or neurologic sequela. The patient is tolerating medications without difficulties and is otherwise without complaint today.    Atrial Fibrillation Risk Factors:  she does have symptoms or diagnosis of sleep apnea. she is compliant with CPAP therapy. she does not have a history of rheumatic fever. she does not have a history of alcohol use. The patient does not have a history of early familial atrial fibrillation or other arrhythmias.  she has a BMI of Body mass index is 46.7 kg/m.Marland Kitchen Filed Weights   09/17/21 1539  Weight: 104.9 kg    No family history on file.   Atrial Fibrillation  Management history:  Previous antiarrhythmic drugs: none Previous cardioversions: none Previous ablations: none CHADS2VASC score: 1 Anticoagulation history: none   Past Medical History:  Diagnosis Date   Depression    GERD (gastroesophageal reflux disease)    Past Surgical History:  Procedure Laterality Date   CESAREAN SECTION     CHOLECYSTECTOMY     ESOPHAGOGASTRODUODENOSCOPY (EGD) WITH PROPOFOL N/A 06/29/2021   Procedure: ESOPHAGOGASTRODUODENOSCOPY (EGD) WITH PROPOFOL;  Surgeon: Jaynie Collins, DO;  Location: [facility] ENDOSCOPY;  Service: Gastroenterology;  Laterality: N/A;    Current Outpatient Medications  Medication Sig Dispense Refill   acetaminophen (TYLENOL) 500 MG tablet Take 500-1,000 mg by mouth every 6 (six) hours as needed.     DULoxetine (CYMBALTA) 30 MG capsule Take 30 mg by mouth daily.     ferrous sulfate 325 (65 FE) MG tablet Take 1 tablet (325 mg total) by mouth daily with breakfast. 30 tablet 1   meclizine (ANTIVERT) 25 MG tablet Take 25 mg by mouth every 12 (twelve) hours as needed for dizziness.     pantoprazole (PROTONIX) 40 MG tablet Take 40 mg by mouth daily.  12   polyethylene glycol powder (GLYCOLAX/MIRALAX) 17 GM/SCOOP powder Take 17 g by mouth daily. Take as needed to produce 1 normal bowel movement per day. 510 g 0   predniSONE (DELTASONE) 20 MG tablet Take by mouth.     triamcinolone cream (KENALOG) 0.1 % Apply 1 application. topically 2 (two) times daily. 30 g 0   No current facility-administered medications for this encounter.    No Known  Allergies  Social History   Socioeconomic History   Marital status: Divorced    Spouse name: Not on file   Number of children: Not on file   Years of education: Not on file   Highest education level: Not on file  Occupational History   Not on file  Tobacco Use   Smoking status: Former   Smokeless tobacco: Never   Tobacco comments:    Former smoker 09/17/21  Substance and Sexual Activity    Alcohol use: Not Currently   Drug use: Never   Sexual activity: Not on file  Other Topics Concern   Not on file  Social History Narrative   Not on file   Social Determinants of Health   Financial Resource Strain: Not on file  Food Insecurity: Not on file  Transportation Needs: Not on file  Physical Activity: Not on file  Stress: Not on file  Social Connections: Not on file  Intimate Partner Violence: Not on file     ROS- All systems are reviewed and negative except as per the HPI above.  Physical Exam: Vitals:   09/17/21 1539  BP: 124/78  Pulse: 68  Weight: 104.9 kg  Height: 4\' 11"  (1.499 m)    GEN- The patient is a well appearing obese female, alert and oriented x 3 today.   Head- normocephalic, atraumatic Eyes-  Sclera clear, conjunctiva pink Ears- hearing intact Oropharynx- clear Neck- supple  Lungs- Clear to ausculation bilaterally, normal work of breathing Heart- Regular rate and rhythm, no rubs or gallops, 2/6 systolic murmur   GI- soft, NT, ND, + BS Extremities- no clubbing, cyanosis, or edema MS- no significant deformity or atrophy Skin- no rash or lesion Psych- euthymic mood, full affect Neuro- strength and sensation are intact  Wt Readings from Last 3 Encounters:  09/17/21 104.9 kg  09/10/21 111.1 kg  06/28/21 111.1 kg    EKG today demonstrates  SR Vent. rate 68 BPM PR interval 140 ms QRS duration 82 ms QT/QTcB 396/421 ms  Epic records are reviewed at length today  CHA2DS2-VASc Score = 1  The patient's score is based upon: CHF History: 0 HTN History: 0 Diabetes History: 0 Stroke History: 0 Vascular Disease History: 0 Age Score: 0 Gender Score: 1       ASSESSMENT AND PLAN: 1. Paroxysmal Atrial Fibrillation (ICD10:  I48.0) The patient's CHA2DS2-VASc score is 1, indicating a 0.6% annual risk of stroke.   General education about afib provided and questions answered. We also discussed her stroke risk and the risks and benefits of  anticoagulation. Check echocardiogram  Anticoagulation not indicated at this time with low CV score.   2. Obesity Body mass index is 46.7 kg/m. Lifestyle modification was discussed at length including regular exercise and weight reduction.  3. Obstructive sleep apnea The importance of adequate treatment of sleep apnea was discussed today in order to improve our ability to maintain sinus rhythm long term. Patient reports compliance with CPAP therapy.  4. Systolic murmur Check echocardiogram as above.    Follow up in the AF clinic in one month.    [facility] [address] [place], [place] [address] [phone] 09/17/2021 3:48 PM

## 2021-10-07 ENCOUNTER — Ambulatory Visit (HOSPITAL_COMMUNITY)
Admission: RE | Admit: 2021-10-07 | Discharge: 2021-10-07 | Disposition: A | Discharge status: Home / Self Care | Payer: Managed Care, Other (non HMO) | Source: Ambulatory Visit | Attending: Physician Assistant | Admitting: Physician Assistant

## 2021-10-07 DIAGNOSIS — I480 Paroxysmal atrial fibrillation: Secondary | ICD-10-CM | POA: Diagnosis not present

## 2021-10-07 HISTORY — PX: TRANSTHORACIC ECHOCARDIOGRAM: SHX275

## 2021-10-07 LAB — ECHOCARDIOGRAM COMPLETE
Area-P 1/2: 3.42 cm2
S' Lateral: 2.4 cm

## 2021-10-23 ENCOUNTER — Encounter (HOSPITAL_COMMUNITY): Payer: Self-pay

## 2021-10-23 ENCOUNTER — Ambulatory Visit (HOSPITAL_COMMUNITY): Payer: Managed Care, Other (non HMO) | Admitting: Physician Assistant

## 2022-06-27 HISTORY — PX: NM MYOVIEW LTD: HXRAD82

## 2022-06-29 ENCOUNTER — Emergency Department: Payer: Managed Care, Other (non HMO)

## 2022-06-29 ENCOUNTER — Emergency Department
Admission: EM | Admit: 2022-06-29 | Discharge: 2022-06-29 | Disposition: A | Discharge status: Home / Self Care | Payer: Managed Care, Other (non HMO) | Attending: Emergency Medicine | Admitting: Emergency Medicine

## 2022-06-29 ENCOUNTER — Other Ambulatory Visit: Payer: Self-pay

## 2022-06-29 DIAGNOSIS — S8001XA Contusion of right knee, initial encounter: Secondary | ICD-10-CM

## 2022-06-29 DIAGNOSIS — I480 Paroxysmal atrial fibrillation: Secondary | ICD-10-CM | POA: Diagnosis not present

## 2022-06-29 DIAGNOSIS — S20219A Contusion of unspecified front wall of thorax, initial encounter: Secondary | ICD-10-CM | POA: Insufficient documentation

## 2022-06-29 DIAGNOSIS — M25561 Pain in right knee: Secondary | ICD-10-CM | POA: Diagnosis present

## 2022-06-29 DIAGNOSIS — W010XXA Fall on same level from slipping, tripping and stumbling without subsequent striking against object, initial encounter: Secondary | ICD-10-CM | POA: Diagnosis not present

## 2022-06-29 DIAGNOSIS — S8002XA Contusion of left knee, initial encounter: Secondary | ICD-10-CM | POA: Insufficient documentation

## 2022-06-29 MED ORDER — ACETAMINOPHEN 325 MG PO TABS
650.0000 mg | ORAL_TABLET | Freq: Once | ORAL | Status: AC
  Administered 2022-06-29: 650 mg via ORAL
  Filled 2022-06-29: qty 2

## 2022-06-29 NOTE — Discharge Instructions (Signed)
Rest, ice, elevate your legs.  You may wear the Ace wrap for extra support.  Please return for any new, worsening, or change in symptoms or other concerns.  It was a pleasure caring for you today.

## 2022-06-29 NOTE — ED Provider Notes (Signed)
[facility] Provider Note    Event Date/Time   First MD Initiated Contact with Patient 06/29/22 [phone]     (approximate)   History   Knee Injury and Back Pain   HPI  Margaret Hansen is a 62 y.o. female with a past medical history of morbid obesity presents today for evaluation after a fall this morning.  Patient reports that she was at the dog park talking with somebody when a dog ran over and knocked her legs out from underneath her.  She reports that she landed on both of her knees.  She denies head strike or LOC.  She reports that her right knee hurts more than her left.  She has been able to ambulate.  She also notes that she has pain where she struck the front of her chest as well.  No shortness of breath or trouble breathing.  No vomiting.  Patient Active Problem List   Diagnosis Date Noted   Paroxysmal atrial fibrillation ([place]) 09/17/2021   Symptomatic anemia 06/28/2021   Depression    Morbid obesity with BMI of 40.0-44.9, adult [facility])           Physical Exam   Triage Vital Signs: ED Triage Vitals  Enc Vitals Group     BP 06/29/22 0717 (!) 92/55     Pulse Rate 06/29/22 0716 96     Resp 06/29/22 0716 20     Temp 06/29/22 0716 99 F (37.2 C)     Temp Source 06/29/22 0716 Oral     SpO2 06/29/22 0716 100 %     Weight 06/29/22 0716 231 lb 7.7 oz (105 kg)     Height 06/29/22 0716 '4\' 11"'$  (1.499 m)     Head Circumference --      Peak Flow --      Pain Score 06/29/22 0715 8     Pain Loc --      Pain Edu? --      Excl. in [place]? --     Most recent vital signs: Vitals:   06/29/22 0716 06/29/22 0717  BP:  (!) 92/55  Pulse: 96   Resp: 20   Temp: 99 F (37.2 C)   SpO2: 100%     Physical Exam Vitals and nursing note reviewed.  Constitutional:      General: Awake and alert. No acute distress.    Appearance: Normal appearance. The patient is obese.  HENT:     Head: Normocephalic and atraumatic.     Mouth: Mucous membranes are moist.   Eyes:     General: PERRL. Normal EOMs        Right eye: No discharge.        Left eye: No discharge.     Conjunctiva/sclera: Conjunctivae normal.  Cardiovascular:     Rate and Rhythm: Normal rate and regular rhythm.     Pulses: Normal pulses.  Pulmonary:     Effort: Pulmonary effort is normal. No respiratory distress.     Breath sounds: Normal breath sounds.  No chest wall ecchymosis or crepitus.  No skin injuries.  Mild tenderness palpation to lateral chest wall bilaterally. Abdominal:     Abdomen is soft. There is no abdominal tenderness. No rebound or guarding. No distention. Musculoskeletal:        General: No swelling. Normal range of motion.     Cervical back: Normal range of motion and neck supple.  Left knee: No deformity or rash. No joint line tenderness.  No patellar tenderness, no ballotment Warm and well perfused extremity with 2+ pedal pulses 5/5 strength to dorsiflexion and plantarflexion at the ankle with intact sensation throughout extremity Normal range of motion of the knee, with intact flexion and extension to active and passive range of motion. Extensor mechanism intact. No ligamentous laxity. Negative anterior/posterior drawer/negative lachman, negative mcmurrays No effusion or warmth Intact quadriceps, hamstring function, patellar tendon function Pelvis stable Full ROM of ankle without pain or swelling Foot warm and well perfused Right knee: No deformity or rash. Lateral and anterior joint line tenderness. No patellar tenderness, no ballotment Warm and well perfused extremity with 2+ pedal pulses 5/5 strength to dorsiflexion and plantarflexion at the ankle with intact sensation throughout extremity Full range of motion of the knee though with pain with flexion past 90 degrees, with intact flexion and extension to active and passive range of motion. Extensor mechanism intact. No ligamentous laxity. Negative anterior/posterior drawer/negative lachman, Discomfort  with mcmurrays No effusion or warmth Intact quadriceps, hamstring function, patellar tendon function Pelvis stable Full ROM of ankle without pain or swelling Foot warm and well perfused Skin:    General: Skin is warm and dry.     Capillary Refill: Capillary refill takes less than 2 seconds.     Findings: No rash.  Neurological:     Mental Status: The patient is awake and alert.      ED Results / Procedures / Treatments   Labs (all labs ordered are listed, but only abnormal results are displayed) Labs Reviewed - No data to display   EKG     RADIOLOGY I independently reviewed and interpreted imaging and agree with radiologists findings.     PROCEDURES:  Critical Care performed:   Procedures   MEDICATIONS ORDERED IN ED: Medications  acetaminophen (TYLENOL) tablet 650 mg (650 mg Oral Given 06/29/22 0816)     IMPRESSION / MDM / ASSESSMENT AND PLAN / ED COURSE  I reviewed the triage vital signs and the nursing notes.   Differential diagnosis includes, but is not limited to, fracture, dislocation, sprain/ligamental injury, meniscus injury.  Patient is awake and alert, hemodynamically stable and afebrile.  She is able to ambulate.  She has normal distal pulses, compartments are soft and compressible throughout.  X-rays were obtained of her bilateral knees and her chest, which are her areas of discomfort.  There are no obvious deformities, wounds, skin changes, or laxity noted on exam.  She was treated symptomatically with Tylenol with improvement of her symptoms.  X-rays obtained are negative for any acute findings.  Patient is reassured by these findings.  Her extensor mechanism is intact bilaterally and she is able to ambulate with a steady gait.  Normal distal pulses, normal distal sensation, no ligamental laxity or knee effusion, do not suspect vascular injury.  She was given an Ace wrap for extra support.  She was instructed to follow-up with orthopedics and the  appropriate information was provided.  We discussed symptomatic management and return precautions.  Patient understands and agrees with plan.  She was discharged in stable condition.   Patient's presentation is most consistent with acute complicated illness / injury requiring diagnostic workup.    FINAL CLINICAL IMPRESSION(S) / ED DIAGNOSES   Final diagnoses:  Contusion of left knee, initial encounter  Contusion of right knee, initial encounter  Contusion of chest wall, unspecified laterality, initial encounter     Rx / DC Orders   ED Discharge Orders     None  Note:  This document was prepared using Dragon voice recognition software and may include unintentional dictation errors.   Marquette Old, PA-C 06/29/22 [id number]    Nathaniel Man, MD 06/29/22 1520

## 2022-06-29 NOTE — ED Triage Notes (Signed)
Pt reports yesterday at the dog park some dogs took her out. Pt reports fell and hurt her knees, right knee is worse and swollen and painful to walk on. Pt also reports rib pain on both sides. Denies LOC, head injuries or other injuries.

## 2022-07-01 ENCOUNTER — Other Ambulatory Visit (HOSPITAL_COMMUNITY): Payer: Self-pay | Admitting: Nurse Practitioner

## 2022-07-01 ENCOUNTER — Encounter (HOSPITAL_COMMUNITY): Payer: Self-pay | Admitting: Nurse Practitioner

## 2022-07-01 ENCOUNTER — Ambulatory Visit (HOSPITAL_COMMUNITY)
Admission: RE | Admit: 2022-07-01 | Discharge: 2022-07-01 | Disposition: A | Discharge status: Home / Self Care | Payer: Managed Care, Other (non HMO) | Source: Ambulatory Visit | Attending: Nurse Practitioner | Admitting: Nurse Practitioner

## 2022-07-01 VITALS — BP 86/70 | HR 84 | Ht 59.0 in | Wt 242.2 lb

## 2022-07-01 DIAGNOSIS — Z6841 Body Mass Index (BMI) 40.0 and over, adult: Secondary | ICD-10-CM | POA: Diagnosis not present

## 2022-07-01 DIAGNOSIS — G4733 Obstructive sleep apnea (adult) (pediatric): Secondary | ICD-10-CM | POA: Insufficient documentation

## 2022-07-01 DIAGNOSIS — I2089 Other forms of angina pectoris: Secondary | ICD-10-CM

## 2022-07-01 DIAGNOSIS — R0602 Shortness of breath: Secondary | ICD-10-CM | POA: Insufficient documentation

## 2022-07-01 DIAGNOSIS — E669 Obesity, unspecified: Secondary | ICD-10-CM | POA: Insufficient documentation

## 2022-07-01 DIAGNOSIS — I480 Paroxysmal atrial fibrillation: Secondary | ICD-10-CM | POA: Diagnosis not present

## 2022-07-01 NOTE — Progress Notes (Addendum)
Primary Care Physician: Collene Leyden, MD Primary Cardiologist: none Primary Electrophysiologist: none Referring Physician: [facility]   Margaret Hansen is a 62 y.o. female with a history of anemia s/p transfusion 06/28/21, OSA, atrial fibrillation who presents for consultation in the [facility]. Patient was admitted for anemia with Hgb 5.8 on 06/28/21. She was transfused two units of PRBC and had EGD which did not show a clear source. She went for her outpatient colonoscopy 09/10/21. She was told her HR was elevated prior to her scope, was given a "short acting medication to lower her heart rate" during her procedure but HR remained elevated afterwards and she was sent to the ER for an EKG. Patient arrived in the emergency room with concern for A-fib with rate of 147 on her EKG, no prior history of A-fib.  Upon arrival in the room and placed on the monitor, found to be in sinus rhythm with a rate in the 90s. Patient has a CHADS2VASC score of 1. She was asymptomatic during her arrhythmia. Today, patient reports that she feels well. She denies significant alcohol use and is compliant with her CPAP.  Today, she denies symptoms of palpitations, chest pain, shortness of breath, orthopnea, PND, lower extremity edema, dizziness, presyncope, syncope, snoring, daytime somnolence, bleeding, or neurologic sequela. The patient is tolerating medications without difficulties and is otherwise without complaint today.   F/u in afib clinic, 07/01/22. She was at PCP office recently and was describing jaw pain and shortness of breath upon walking.it goes away with rest.  She will occasional see an elevated HR in the 130's on the app on her phone but it last just a few minutes. She also states she was at the dog park last week and a dog knocked her down from  behind and hurt her knee. She is not on anticoagulation 2/2 low CHA2DS2VASc  score of 1. She is in SR today. Her BP is 86/70. H/o of low BP.   She states she is not currently using her cpap as she needs needs new supplies.      Atrial Fibrillation Risk Factors:  she does have symptoms or diagnosis of sleep apnea. she is compliant with CPAP therapy. she does not have a history of rheumatic fever. she does not have a history of alcohol use. The patient does not have a history of early familial atrial fibrillation or other arrhythmias.  she has a BMI of There is no height or weight on file to calculate BMI.. There were no vitals filed for this visit.   No family history on file.   Atrial Fibrillation Management history:  Previous antiarrhythmic drugs: none Previous cardioversions: none Previous ablations: none CHADS2VASC score: 1 Anticoagulation history: none   Past Medical History:  Diagnosis Date   Depression    GERD (gastroesophageal reflux disease)    Past Surgical History:  Procedure Laterality Date   CESAREAN SECTION     CHOLECYSTECTOMY     ESOPHAGOGASTRODUODENOSCOPY (EGD) WITH PROPOFOL N/A 06/29/2021   Procedure: ESOPHAGOGASTRODUODENOSCOPY (EGD) WITH PROPOFOL;  Surgeon: Annamaria Helling, DO;  Location: [facility] ENDOSCOPY;  Service: Gastroenterology;  Laterality: N/A;    Current Outpatient Medications  Medication Sig Dispense Refill   acetaminophen (TYLENOL) 500 MG tablet Take 500-1,000 mg by mouth every 6 (six) hours as needed.     DULoxetine (CYMBALTA) 30 MG capsule Take 30 mg by mouth daily.     ferrous sulfate 325 (65 FE) MG tablet Take 1 tablet (  325 mg total) by mouth daily with breakfast. 30 tablet 1   meclizine (ANTIVERT) 25 MG tablet Take 25 mg by mouth every 12 (twelve) hours as needed for dizziness.     pantoprazole (PROTONIX) 40 MG tablet Take 40 mg by mouth daily.  12   polyethylene glycol powder (GLYCOLAX/MIRALAX) 17 GM/SCOOP powder Take 17 g by mouth daily. Take as needed to produce 1 normal bowel movement per day. 510 g 0   predniSONE (DELTASONE) 20 MG tablet Take by mouth.      triamcinolone cream (KENALOG) 0.1 % Apply 1 application. topically 2 (two) times daily. 30 g 0   No current facility-administered medications for this encounter.    No Known Allergies  Social History   Socioeconomic History   Marital status: Divorced    Spouse name: Not on file   Number of children: Not on file   Years of education: Not on file   Highest education level: Not on file  Occupational History   Not on file  Tobacco Use   Smoking status: Former   Smokeless tobacco: Never   Tobacco comments:    Former smoker 09/17/21  Substance and Sexual Activity   Alcohol use: Not Currently   Drug use: Never   Sexual activity: Not on file  Other Topics Concern   Not on file  Social History Narrative   Not on file   Social Determinants of Health   Financial Resource Strain: Not on file  Food Insecurity: Not on file  Transportation Needs: Not on file  Physical Activity: Not on file  Stress: Not on file  Social Connections: Not on file  Intimate Partner Violence: Not on file     ROS- All systems are reviewed and negative except as per the HPI above.  Physical Exam: There were no vitals filed for this visit.   GEN- The patient is a well appearing obese female, alert and oriented x 3 today.   Head- normocephalic, atraumatic Eyes-  Sclera clear, conjunctiva pink Ears- hearing intact Oropharynx- clear Neck- supple  Lungs- Clear to ausculation bilaterally, normal work of breathing Heart- Regular rate and rhythm, no rubs or gallops, 2/6 systolic murmur   GI- soft, NT, ND, + BS Extremities- no clubbing, cyanosis, or edema MS- no significant deformity or atrophy Skin- no rash or lesion Psych- euthymic mood, full affect Neuro- strength and sensation are intact  Wt Readings from Last 3 Encounters:  06/29/22 105 kg  09/17/21 104.9 kg  09/10/21 111.1 kg   EKG-Vent. rate 84 BPM PR interval 154 ms QRS duration 90 ms QT/QTcB 364/430 ms P-R-T axes 61 93 30 Normal  sinus rhythm When compared with ECG of 17-Sep-2021 15:42, No significant change since last tracing Confirmed by Lenna Sciara ([id number]) on 07/01/2022 10:23:51 AM   Epic records are reviewed at length today  CHA2DS2-VASc Score = 1  The patient's score is based upon: CHF History: 0 HTN History: 0 Diabetes History: 0 Stroke History: 0 Vascular Disease History: 0 Age Score: 0 Gender Score: 1     Echo-1. Left ventricular ejection fraction, by estimation, is 60 to 65%. The  left ventricle has normal function. The left ventricle has no regional  wall motion abnormalities. Left ventricular diastolic parameters are  consistent with Grade II diastolic  dysfunction (pseudonormalization).   2. Right ventricular systolic function is normal. The right ventricular  size is normal. There is normal pulmonary artery systolic pressure.   3. Left atrial size was moderately dilated.  4. The mitral valve is normal in structure. Trivial mitral valve  regurgitation. No evidence of mitral stenosis.   5. The aortic valve is tricuspid. Aortic valve regurgitation is trivial.  No aortic stenosis is present.   6. The inferior vena cava is normal in size with greater than 50%  respiratory variability, suggesting right atrial pressure of 3 mmHg.   ASSESSMENT AND PLAN: 1. Paroxysmal Atrial Fibrillation (ICD10:  I48.0) The patient's CHA2DS2-VASc score is 1, indicating a 0.6% annual risk of stroke.   Pt may be having some breakthrough afib but very short lived. We discussed daily BB but unable to do this with her very soft BP.  Will continue to monitor.  Anticoagulation not indicated at this time with low CV score.   2. Obesity BMI 48.92  Weight loss discussed   3. Obstructive sleep apnea Needs some supplies for cpap as she is currently not using it  Encouraged to start back using   4. Exertional jaw discomfort and shortness of breath  Will order a lexi myoview    I will refer to [facility] in [place] as  pt is from the area.    Geroge Baseman Jasa Dundon, [facility] [address] [place], [place] [address] [phone]

## 2022-07-01 NOTE — Patient Instructions (Signed)
Follow up with primary care regarding cpap

## 2022-07-08 ENCOUNTER — Other Ambulatory Visit (HOSPITAL_COMMUNITY): Payer: Self-pay | Admitting: *Deleted

## 2022-07-08 DIAGNOSIS — I2089 Other forms of angina pectoris: Secondary | ICD-10-CM

## 2022-07-10 ENCOUNTER — Telehealth (HOSPITAL_COMMUNITY): Payer: Self-pay | Admitting: *Deleted

## 2022-07-10 NOTE — Telephone Encounter (Signed)
Patient given detailed instructions per Myocardial Perfusion Study Information Sheet for the test on 07/15/2022 at 1:30. Patient notified to arrive 15 minutes early and that it is imperative to arrive on time for appointment to keep from having the test rescheduled.  If you need to cancel or reschedule your appointment, please call the office within 24 hours of your appointment. . Patient verbalized understanding.Margaret Hansen

## 2022-07-15 ENCOUNTER — Ambulatory Visit (HOSPITAL_COMMUNITY): Payer: Managed Care, Other (non HMO) | Attending: Nurse Practitioner

## 2022-07-15 DIAGNOSIS — I2089 Other forms of angina pectoris: Secondary | ICD-10-CM | POA: Insufficient documentation

## 2022-07-15 MED ORDER — TECHNETIUM TC 99M TETROFOSMIN IV KIT
32.5000 | PACK | Freq: Once | INTRAVENOUS | Status: AC | PRN
  Administered 2022-07-15: 32.5 via INTRAVENOUS

## 2022-07-15 MED ORDER — REGADENOSON 0.4 MG/5ML IV SOLN
0.4000 mg | Freq: Once | INTRAVENOUS | Status: AC
  Administered 2022-07-15: 0.4 mg via INTRAVENOUS

## 2022-07-16 ENCOUNTER — Ambulatory Visit (HOSPITAL_COMMUNITY): Payer: Managed Care, Other (non HMO) | Attending: Nurse Practitioner

## 2022-07-16 LAB — MYOCARDIAL PERFUSION IMAGING
LV dias vol: 81 mL (ref 46–106)
LV sys vol: 42 mL
Nuc Stress EF: 48 %
Peak HR: 84 {beats}/min
Rest HR: 69 {beats}/min
Rest Nuclear Isotope Dose: 32.9 mCi
SDS: 3
SRS: 1
SSS: 4
ST Depression (mm): 0 mm
Stress Nuclear Isotope Dose: 32.5 mCi
TID: 0.91

## 2022-07-16 MED ORDER — TECHNETIUM TC 99M TETROFOSMIN IV KIT
32.9000 | PACK | Freq: Once | INTRAVENOUS | Status: AC | PRN
  Administered 2022-07-16: 32.9 via INTRAVENOUS

## 2022-08-06 ENCOUNTER — Ambulatory Visit: Payer: Managed Care, Other (non HMO) | Attending: Cardiology | Admitting: Cardiology

## 2022-08-06 ENCOUNTER — Encounter: Payer: Self-pay | Admitting: Cardiology

## 2022-08-06 ENCOUNTER — Ambulatory Visit (INDEPENDENT_AMBULATORY_CARE_PROVIDER_SITE_OTHER): Payer: Managed Care, Other (non HMO)

## 2022-08-06 VITALS — BP 98/60 | HR 75 | Ht 59.0 in | Wt 242.0 lb

## 2022-08-06 DIAGNOSIS — I480 Paroxysmal atrial fibrillation: Secondary | ICD-10-CM

## 2022-08-06 DIAGNOSIS — Z6841 Body Mass Index (BMI) 40.0 and over, adult: Secondary | ICD-10-CM

## 2022-08-06 DIAGNOSIS — I2089 Other forms of angina pectoris: Secondary | ICD-10-CM

## 2022-08-06 DIAGNOSIS — R002 Palpitations: Secondary | ICD-10-CM | POA: Diagnosis not present

## 2022-08-06 DIAGNOSIS — D649 Anemia, unspecified: Secondary | ICD-10-CM

## 2022-08-06 MED ORDER — RANOLAZINE ER 500 MG PO TB12: 500.0000 mg | ORAL_TABLET | Freq: Two times a day (BID) | ORAL | 6 refills | Status: DC

## 2022-08-06 NOTE — Progress Notes (Signed)
Primary Care Provider: Irven Coe, MD [facility] Cardiologist: Bryan Lemma, MD Electrophysiologist: None   No Name Atrial Fibrillation Clinic: Seen by Mr. Theadora Rama, [place] and Rudi Coco, NP  Clinic Note: Chief Complaint  Patient presents with   Follow-up    Referred by A-fib clinic for jaw pain on exertion.  History of PAF   Jaw Pain    Exertional, associated dyspnea; recent Myoview nonischemic.   Atrial Fibrillation    Reportedly "asymptomatic ".  A-fib burden not yet determined.   ===================================  ASSESSMENT/PLAN   Problem List Items Addressed This Visit       Cardiology Problems   Paroxysmal atrial fibrillation (Chronic)    Per report, she has been asymptomatic, although we have never assessed her A-fib burden.  Her symptoms of jaw/neck discomfort and dyspnea are associated with tachycardia.  Clearly with an undetermined etiology for anemia, low risk CHA2DS2-VASc score, she is not on DOAC.  Not currently on even a beta-blocker or gastroenterological rate control based on her hypotension.  BP today 98/60.      Relevant Medications   ranolazine (RANEXA) 500 MG 12 hr tablet   Other Relevant Orders   EKG 12-Lead (Completed)   LONG TERM MONITOR (3-14 DAYS)   Atypical angina - Primary    Exertional jaw pain with dyspnea that relieves with rest.  Interestingly this is associated with tachycardia probably out of proportion with valve activity she is doing. Myoview last month was nonischemic with mildly reduced EF despite the fact that in June, TTE showed normal EF with normal wall motion.  I would like to exclude A-fib as etiology by ordering 7-day Zio patch monitor to see if we can capture her episodes of chest discomfort and potentially recurrence of A-fib.  If they correlate together then we would simply want to treat A-fib with a potential antiarrhythmic agent.  (AA)  Unusual that she would have had a nonischemic Myoview and still have  symptoms that would be concerning for angina.  Also need to exclude recurrent anemia the patient has a history of anemia.  Plan: Check Zio patch monitor, CBC and iron panel with ferritin, B12 and folate In the off chance that she is indeed having either micro or macrovascular angina, we do not have BP room to use beta-blockers or calcium blockers, therefore we will start Ranexa 500 mg twice daily.        Relevant Medications   ranolazine (RANEXA) 500 MG 12 hr tablet   Other Relevant Orders   EKG 12-Lead (Completed)   LONG TERM MONITOR (3-14 DAYS)     Other   Symptomatic anemia (Chronic)    Back in March 2023-noted to have symptomatic anemia with iron deficiency.  Transfused 2 units.  She remains on PPI and iron supplementation.  Plan: Check CBC with iron panel, B12-folate panel. If she is anemic, would defer back to PCP for further evaluation.  It seems like she has had a EGD and colonoscopy.  May want to consider hematology consultation.  Will hold off on recommending aspirin for CVA prophylaxis and A-fib.      Relevant Orders   CBC (Completed)   Iron, TIBC and Ferritin Panel (Completed)   B12 and Folate Panel (Completed)   Rapid palpitations    She is describing fast heart rate spells associated with neck and jaw discomfort.  Usually exertional, this seems like it is out of proportion.  She has documented A-fib, but I like to see if the 2  are combined or different.  Plan: 70 Zio patch monitor.      Relevant Orders   EKG 12-Lead (Completed)   LONG TERM MONITOR (3-14 DAYS)   Morbid obesity with BMI of 40.0-44.9, adult (Chronic)    Weight loss recommended.  Unfortunate present, she is not able to exercise because of knee pain and also now jaw pain and dyspnea.  With recent Myoview being negative, last report showed that she again is having symptomatic anemia, would probably consider Coronary CTA for definitive evaluation to exclude macrovascular CAD..       ===================================  HPI:    RAIVYN KABLER is a 62 y.o. female with PMH notable for Recurrent Anemia (Fe Deficient - s/p PRBC transfusion in 06/2021), OSA-CPAP associated with PAF who is being seen today for the evaluation of CHEST-JAW PAIN and EXERTIONAL DYSPNEA at the request of Newman Nip, NP.  PMH: History of SymptomaticAnemia s/p transfusion 06/28/21: Admitted with hemoglobin of 5.8-2 units PRBC, EGD normal.    Paroxysmal Atrial Fibrillation: CHA2DS2-VASc score 1 (female).  Presumably was asymptomatic with tachycardia. 09/10/2021: Had tachycardia during colonoscopy => sent to ER after EKG noted A-fib RVR with rate 147 bpm.  No prior history.  Resolved spontaneously after short acting IV medication prior to arrival to ER. Not on DOAC because of concerns for GI bleed. OSA-usually on CPAP, Morbid Obesity Depression-on Cymbalta  LAWONDA PRETLOW was last seen in the [facility] by Rudi Coco on 07/01/22. She was at PCP office recently and was describing jaw pain and shortness of breath upon walking.it goes away with rest.  She will occasional see an elevated HR in the 130's on the app on her phone but it last just a few minutes.  She is not on anticoagulation 2/2 low CHA2DS2VASc  score of 1, and history of symptomatic anemia.  She was notably in sinus rhythm..  Her BP was quite low (86/60 mmHg)-she has a history of this.   Noted that she was not using her CPAP-needs new supplies.    Lexiscan Myoview ordered   Recent Hospitalizations:  09/10/2021: ER visit for A-fib RVR noted with colonoscopy 06/29/2022: [facility] ER.  Was at the dog park talking on friend, a dog ran over and knocked her legs from underneath her.  She landed on both knees.  No head strike or LOC.  Right knee pain more than left.  Was able to ambulate.  She also struck the front of her chest.  Reviewed  CV studies:    The following studies were reviewed today: (if available, images/films reviewed:  From Epic Chart or Care Everywhere) TTE 10/07/2021: EF 60 to 65%.  Normal LV size and function.  No RWMA.  GR 2 DD.  Moderate LA dilation.  Normal PAP.  Normal valves.  Normal RAP. Myoview 06/2022:  Normal / Low Risk -> EF 45-55%. ? cannot exclude apical hypokinesis.  Interval History:   ELIE GRAGERT presents here today for general cardiology follow-up after her Myoview.  She still notes that she is just feeling horrible.  She says that she has this reproducible jaw pain but it has been over the last month or so.  It is worse with exertion.  To the point where she can barely walk around the neighborhood.  It has been happening a little more frequently almost daily now.  With less exertion.  She describes it as a neck and jaw discomfort and is also associated with a rapid heart rate.  She gets short of breath and very fatigued.  Has to stop and rest.  She does not necessarily note any of the symptoms independent of the the other.  No real resting symptoms of dyspnea or chest tightness/jaw pain.  No PND, orthopnea with trivial end of day edema. She does have episodes where heart rate will go fast as at rest that are relatively short-lived.  She has not noted any signs or symptoms to suggest bleeding such as melena, hematochezia, hematuria or epistaxis.  No CHF symptoms of PND, or orthopnea to go along with the mild end of day edema.  Although she feels lightheaded and weak/fatigued when she has her episodes, she has not had anything.  Near syncope.  No TIA or FUGAX.  No claudication.  REVIEWED OF SYSTEMS   Review of Systems  Constitutional:  Positive for malaise/fatigue (Exercise intolerance-associated dyspnea and jaw pain). Negative for weight loss.  HENT:  Negative for congestion and sinus pain.   Respiratory:  Positive for shortness of breath (Exertional dyspnea).   Cardiovascular:        Per HPI  Gastrointestinal:  Negative for abdominal pain, blood in stool, diarrhea and melena.   Genitourinary:  Negative for dysuria, flank pain, frequency and hematuria.  Musculoskeletal:  Positive for joint pain (Mostly her right knee from her fall.) and neck pain (Neck and jaw pain per HPI).  Neurological:  Negative for dizziness, focal weakness and weakness.  Endo/Heme/Allergies:  Does not bruise/bleed easily.  Psychiatric/Behavioral:  Negative for depression and memory loss. The patient is nervous/anxious. The patient does not have insomnia.    I have reviewed and (if needed) personally updated the patient's problem list, medications, allergies, past medical and surgical history, social and family history.   PAST MEDICAL HISTORY   Past Medical History:  Diagnosis Date   Depression    GERD (gastroesophageal reflux disease)    Iron deficiency anemia due to chronic blood loss 06/2021   Admitted with hemoglobin of 5.8-2 units PRBC, EGD normal.    PAST SURGICAL HISTORY   Past Surgical History:  Procedure Laterality Date   CESAREAN SECTION     CHOLECYSTECTOMY     ESOPHAGOGASTRODUODENOSCOPY (EGD) WITH PROPOFOL N/A 06/29/2021   Procedure: ESOPHAGOGASTRODUODENOSCOPY (EGD) WITH PROPOFOL;  Surgeon: Jaynie Collins, DO;  Location: [facility] ENDOSCOPY;  Service: Gastroenterology;  Laterality: N/A;   NM MYOVIEW LTD  06/2022   Lexiscan: Normal / Low Risk -> EF 45-55%. ? cannot exclude apical hypokinesis.   TRANSTHORACIC ECHOCARDIOGRAM  10/07/2021   EF 60 to 65%.  Normal LV size and function.  No RWMA.  GR 2 DD.  Moderate LA dilation.  Normal PAP.  Normal valves.  Normal RAP.    There is no immunization history on file for this patient.  MEDICATIONS/ALLERGIES   Current Meds  Medication Sig   acetaminophen (TYLENOL) 500 MG tablet Take 500-1,000 mg by mouth every 6 (six) hours as needed.   DULoxetine (CYMBALTA) 30 MG capsule Take 30 mg by mouth daily.   ferrous sulfate 325 (65 FE) MG tablet Take 1 tablet (325 mg total) by mouth daily with breakfast.   meclizine (ANTIVERT) 25 MG  tablet Take 25 mg by mouth every 12 (twelve) hours as needed for dizziness.   pantoprazole (PROTONIX) 40 MG tablet Take 40 mg by mouth daily.   polyethylene glycol powder (GLYCOLAX/MIRALAX) 17 GM/SCOOP powder Take 17 g by mouth daily. Take as needed to produce 1 normal bowel movement per day.   triamcinolone cream (KENALOG)  0.1 % Apply 1 application. topically 2 (two) times daily.   No Known Allergies  SOCIAL HISTORY/FAMILY HISTORY   Reviewed in Epic:   Social History   Tobacco Use   Smoking status: Former   Smokeless tobacco: Never   Tobacco comments:    Former smoker 09/17/21  Substance Use Topics   Alcohol use: Not Currently   Drug use: Never   Social History   Social History Narrative   Not on file   History reviewed. No pertinent family history.  OBJCTIVE -PE, EKG, labs   Wt Readings from Last 3 Encounters:  08/06/22 242 lb (109.8 kg)  07/01/22 242 lb 3.2 oz (109.9 kg)  06/29/22 231 lb 7.7 oz (105 kg)   Physical Exam: BP 98/60 (BP Location: Right Arm, Patient Position: Sitting, Cuff Size: Large)   Pulse 75   Ht 4\' 11"  (1.499 m)   Wt 242 lb (109.8 kg)   SpO2 95%   BMI 48.88 kg/m  Physical Exam Vitals reviewed.  Constitutional:      General: She is not in acute distress.    Appearance: Normal appearance. She is obese. She is not ill-appearing or toxic-appearing.     Comments: Well-nourished and well-groomed.  HENT:     Head: Normocephalic and atraumatic.  Eyes:     Extraocular Movements: Extraocular movements intact.     Conjunctiva/sclera: Conjunctivae normal.     Pupils: Pupils are equal, round, and reactive to light.     Comments: Wearing glasses.  Neck:     Vascular: No carotid bruit.  Cardiovascular:     Rate and Rhythm: Normal rate and regular rhythm.     Pulses: Normal pulses.     Heart sounds: Murmur (Very soft 1/6 HSM at the base and apex) heard.     No friction rub. No gallop.  Pulmonary:     Effort: Pulmonary effort is normal. No  respiratory distress.     Breath sounds: No wheezing, rhonchi or rales.     Comments: Distant breath sounds due to body habitus Abdominal:     General: Bowel sounds are normal. There is no distension.     Palpations: Abdomen is soft.     Tenderness: There is no abdominal tenderness.     Comments: Obese but soft/NT/ND/NABS.  No HSM.  Musculoskeletal:        General: Swelling (Trivial ankle swelling bilaterally.) present.     Cervical back: Normal range of motion and neck supple.  Skin:    General: Skin is warm and dry.     Coloration: Skin is pale. Skin is not jaundiced.  Neurological:     General: No focal deficit present.     Mental Status: She is alert and oriented to person, place, and time.     Gait: Gait (Patient in her right knee.) normal.  Psychiatric:        Mood and Affect: Mood normal.        Thought Content: Thought content normal.        Judgment: Judgment normal.     Comments: Very anxious, frustrated     Adult ECG Report  Rate: 75 ;  Rhythm: normal sinus rhythm and cannot exclude anterior mitral age-indeterminate.  Otherwise normal axis, intervals durations. ;   Narrative Interpretation: Stable  Recent Labs: Reviewed both epic and from [facility] 10/03/2021: KPN-TC 190, TG 102, HDL 55, LDL 117.; Hgb 12 06/27/2021: KPN-A1c 6.2 No results found for: "CHOL", "HDL", "LDLCALC", "LDLDIRECT", "TRIG", "CHOLHDL" Lab Results  Component Value Date   CREATININE 0.77 09/10/2021   BUN 10 09/10/2021   NA 145 09/10/2021   K 3.9 09/10/2021   CL 111 09/10/2021   CO2 26 09/10/2021      Latest Ref Rng & Units 08/06/2022    3:07 PM 09/10/2021    4:34 PM 06/29/2021    4:49 AM  CBC  WBC 3.4 - 10.8 x10E3/uL 10.8  8.8  7.3   Hemoglobin 11.1 - 15.9 g/dL 9.9  16.1  8.1   Hematocrit 34.0 - 46.6 % 34.2  39.6  27.4   Platelets 150 - 450 x10E3/uL 383  321  271     No results found for: "HGBA1C" Lab Results  Component Value Date   TSH 3.362 09/10/2021     ================================================== I spent a total of 26 minutes with the patient spent in direct patient consultation.  Additional time spent with chart review  / charting (studies, outside notes, etc): 20 min Total Time: 46 min  Current medicines are reviewed at length with the patient today.  (+/- concerns) N/A  Notice: This dictation was prepared with Dragon dictation along with smart phrase technology. Any transcriptional errors that result from this process are unintentional and may not be corrected upon review.   Studies Ordered:  Orders Placed This Encounter  Procedures   CBC   Iron, TIBC and Ferritin Panel   B12 and Folate Panel   LONG TERM MONITOR (3-14 DAYS)   EKG 12-Lead   Meds ordered this encounter  Medications   ranolazine (RANEXA) 500 MG 12 hr tablet    Sig: Take 1 tablet (500 mg total) by mouth 2 (two) times daily.    Dispense:  60 tablet    Refill:  6    Patient Instructions / Medication Changes & Studies & Tests Ordered   Patient Instructions  Medication Instructions:    Start generic Ranexa 500 mg  take 12 hours apart twice a day     *If you need a refill on your cardiac medications before your next appointment, please call your pharmacy*   Lab Work: CBC IRON panel B12- folate panel   If you have labs (blood work) drawn today and your tests are completely normal, you will receive your results only by: MyChart Message (if you have MyChart) OR A paper copy in the mail If you have any lab test that is abnormal or we need to change your treatment, we will call you to review the results.   Testing/Procedures: Will be mailed to you in 3 to 7 days Your physician has recommended that you wear a holter monitor 7 days ZIO. Holter monitors are medical devices that record the heart's electrical activity. Doctors most often use these monitors to diagnose arrhythmias. Arrhythmias are problems with the speed or rhythm of the heartbeat.  The monitor is a small, portable device. You can wear one while you do your normal daily activities. This is usually used to diagnose what is causing palpitations/syncope (passing out).    Follow-Up: At [facility], you and your health needs are our priority.  As part of our continuing mission to provide you with exceptional heart care, we have created designated Provider Care Teams.  These Care Teams include your primary Cardiologist (physician) and Advanced Practice Providers (APPs -  Physician Assistants and Nurse Practitioners) who all work together to provide you with the care you need, when you need it.     Your next appointment:   2 month(s)  The format for your next appointment:   Virtual Visit   Provider:   Bryan Lemmaavid Candido Flott, MD    Other Instructions  ZIO XT- Long Term Monitor Instructions  Your physician has requested you wear a ZIO patch monitor for 7 days.  This is a single patch monitor. [employer] supplies one patch monitor per enrollment. Additional stickers are not available. Please do not apply patch if you will be having a Nuclear Stress Test,  Echocardiogram, Cardiac CT, MRI, or Chest Xray during the period you would be wearing the  monitor. The patch cannot be worn during these tests. You cannot remove and re-apply the  ZIO XT patch monitor.  Your ZIO patch monitor will be mailed 3 day USPS to your address on file. It may take 3-5 days  to receive your monitor after you have been enrolled.  Once you have received your monitor, please review the enclosed instructions. Your monitor  has already been registered assigning a specific monitor serial # to you.  Billing and Patient Assistance Program Information  We have supplied [employer] with any of your insurance information on file for billing purposes. [employer] offers a sliding scale Patient Assistance Program for patients that do not have  insurance, or whose insurance does not completely cover the cost of the ZIO  monitor.  You must apply for the Patient Assistance Program to qualify for this discounted rate.  To apply, please call [employer] at [phone], select option 4, select option 2, ask to apply for  Patient Assistance Program. [employer] will ask your household income, and how many people  are in your household. They will quote your out-of-pocket cost based on that information.  [employer] will also be able to set up a 6968-month, interest-free payment plan if needed.  Applying the monitor   Shave hair from upper left chest.  Hold abrader disc by orange tab. Rub abrader in 40 strokes over the upper left chest as  indicated in your monitor instructions.  Clean area with 4 enclosed alcohol pads. Let dry.  Apply patch as indicated in monitor instructions. Patch will be placed under collarbone on left  side of chest with arrow pointing upward.  Rub patch adhesive wings for 2 minutes. Remove white label marked "1". Remove the white  label marked "2". Rub patch adhesive wings for 2 additional minutes.  While looking in a mirror, press and release button in center of patch. A small green light will  flash 3-4 times. This will be your only indicator that the monitor has been turned on.  Do not shower for the first 24 hours. You may shower after the first 24 hours.  Press the button if you feel a symptom. You will hear a small click. Record Date, Time and  Symptom in the Patient Logbook.  When you are ready to remove the patch, follow instructions on the last 2 pages of Patient  Logbook. Stick patch monitor onto the last page of Patient Logbook.  Place Patient Logbook in the blue and white box. Use locking tab on box and tape box closed  securely. The blue and white box has prepaid postage on it. Please place it in the mailbox as  soon as possible. Your physician should have your test results approximately 7 days after the  monitor has been mailed back to [facility].  Call [facility] Customer Care at  [phone] if you have questions regarding  your ZIO XT patch monitor. Call them immediately if you see an orange light  blinking on your  monitor.  If your monitor falls off in less than 4 days, contact our Monitor department at [phone].  If your monitor becomes loose or falls off after 4 days call [employer] at [phone] for  suggestions on securing your monitor     Marykay Lex, MD, MS Bryan Lemma, M.D., M.S. Interventional Cardiologist  [facility]  Pager # [phone] Phone # [phone] [address] [place], [place] [address]   Thank you for choosing [facility] [place]!!

## 2022-08-06 NOTE — Patient Instructions (Addendum)
Medication Instructions:    Start generic Ranexa 500 mg  take 12 hours apart twice a day     *If you need a refill on your cardiac medications before your next appointment, please call your pharmacy*   Lab Work: CBC IRON panel B12- folate panel   If you have labs (blood work) drawn today and your tests are completely normal, you will receive your results only by: MyChart Message (if you have MyChart) OR A paper copy in the mail If you have any lab test that is abnormal or we need to change your treatment, we will call you to review the results.   Testing/Procedures: Will be mailed to you in 3 to 7 days Your physician has recommended that you wear a holter monitor 7 days ZIO. Holter monitors are medical devices that record the heart's electrical activity. Doctors most often use these monitors to diagnose arrhythmias. Arrhythmias are problems with the speed or rhythm of the heartbeat. The monitor is a small, portable device. You can wear one while you do your normal daily activities. This is usually used to diagnose what is causing palpitations/syncope (passing out).    Follow-Up: At [facility], you and your health needs are our priority.  As part of our continuing mission to provide you with exceptional heart care, we have created designated Provider Care Teams.  These Care Teams include your primary Cardiologist (physician) and Advanced Practice Providers (APPs -  Physician Assistants and Nurse Practitioners) who all work together to provide you with the care you need, when you need it.     Your next appointment:   2 month(s)  The format for your next appointment:   Virtual Visit   Provider:   Bryan Lemma, MD    Other Instructions  ZIO XT- Long Term Monitor Instructions  Your physician has requested you wear a ZIO patch monitor for 7 days.  This is a single patch monitor. [employer] supplies one patch monitor per enrollment. Additional stickers are not available.  Please do not apply patch if you will be having a Nuclear Stress Test,  Echocardiogram, Cardiac CT, MRI, or Chest Xray during the period you would be wearing the  monitor. The patch cannot be worn during these tests. You cannot remove and re-apply the  ZIO XT patch monitor.  Your ZIO patch monitor will be mailed 3 day USPS to your address on file. It may take 3-5 days  to receive your monitor after you have been enrolled.  Once you have received your monitor, please review the enclosed instructions. Your monitor  has already been registered assigning a specific monitor serial # to you.  Billing and Patient Assistance Program Information  We have supplied [employer] with any of your insurance information on file for billing purposes. [employer] offers a sliding scale Patient Assistance Program for patients that do not have  insurance, or whose insurance does not completely cover the cost of the ZIO monitor.  You must apply for the Patient Assistance Program to qualify for this discounted rate.  To apply, please call [employer] at [phone], select option 4, select option 2, ask to apply for  Patient Assistance Program. Meredeth Ide will ask your household income, and how many people  are in your household. They will quote your out-of-pocket cost based on that information.  [employer] will also be able to set up a 47-month, interest-free payment plan if needed.  Applying the monitor   Shave hair from upper left chest.  Hold abrader disc  by orange tab. Rub abrader in 40 strokes over the upper left chest as  indicated in your monitor instructions.  Clean area with 4 enclosed alcohol pads. Let dry.  Apply patch as indicated in monitor instructions. Patch will be placed under collarbone on left  side of chest with arrow pointing upward.  Rub patch adhesive wings for 2 minutes. Remove white label marked "1". Remove the white  label marked "2". Rub patch adhesive wings for 2 additional minutes.  While  looking in a mirror, press and release button in center of patch. A small green light will  flash 3-4 times. This will be your only indicator that the monitor has been turned on.  Do not shower for the first 24 hours. You may shower after the first 24 hours.  Press the button if you feel a symptom. You will hear a small click. Record Date, Time and  Symptom in the Patient Logbook.  When you are ready to remove the patch, follow instructions on the last 2 pages of Patient  Logbook. Stick patch monitor onto the last page of Patient Logbook.  Place Patient Logbook in the blue and white box. Use locking tab on box and tape box closed  securely. The blue and white box has prepaid postage on it. Please place it in the mailbox as  soon as possible. Your physician should have your test results approximately 7 days after the  monitor has been mailed back to [facility].  Call [facility] Customer Care at [phone] if you have questions regarding  your ZIO XT patch monitor. Call them immediately if you see an orange light blinking on your  monitor.  If your monitor falls off in less than 4 days, contact our Monitor department at [phone].  If your monitor becomes loose or falls off after 4 days call [employer] at [phone] for  suggestions on securing your monitor

## 2022-08-06 NOTE — Progress Notes (Unsigned)
Enrolled patient for a 7 day Zio XT monitor to be mailed to patients home.  

## 2022-08-07 LAB — CBC
Hematocrit: 34.2 % (ref 34.0–46.6)
Hemoglobin: 9.9 g/dL — ABNORMAL LOW (ref 11.1–15.9)
MCH: 20.7 pg — ABNORMAL LOW (ref 26.6–33.0)
MCHC: 28.9 g/dL — ABNORMAL LOW (ref 31.5–35.7)
MCV: 72 fL — ABNORMAL LOW (ref 79–97)
Platelets: 383 10*3/uL (ref 150–450)
RBC: 4.78 x10E6/uL (ref 3.77–5.28)
RDW: 18.5 % — ABNORMAL HIGH (ref 11.7–15.4)
WBC: 10.8 10*3/uL (ref 3.4–10.8)

## 2022-08-07 LAB — IRON,TIBC AND FERRITIN PANEL
Ferritin: 17 ng/mL (ref 15–150)
Iron Saturation: 12 % — ABNORMAL LOW (ref 15–55)
Iron: 46 ug/dL (ref 27–139)
Total Iron Binding Capacity: 372 ug/dL (ref 250–450)
UIBC: 326 ug/dL (ref 118–369)

## 2022-08-07 LAB — B12 AND FOLATE PANEL
Folate: 9.2 ng/mL (ref >3.0)
Vitamin B-12: 315 pg/mL (ref 232–1245)

## 2022-08-08 DIAGNOSIS — R002 Palpitations: Secondary | ICD-10-CM | POA: Diagnosis not present

## 2022-08-08 DIAGNOSIS — I2089 Other forms of angina pectoris: Secondary | ICD-10-CM | POA: Diagnosis not present

## 2022-08-08 DIAGNOSIS — I480 Paroxysmal atrial fibrillation: Secondary | ICD-10-CM

## 2022-08-09 ENCOUNTER — Encounter: Payer: Self-pay | Admitting: Cardiology

## 2022-08-09 NOTE — Assessment & Plan Note (Signed)
Per report, she has been asymptomatic, although we have never assessed her A-fib burden.  Her symptoms of jaw/neck discomfort and dyspnea are associated with tachycardia.  Clearly with an undetermined etiology for anemia, low risk CHA2DS2-VASc score, she is not on DOAC.  Not currently on even a beta-blocker or gastroenterological rate control based on her hypotension.  BP today 98/60.

## 2022-08-09 NOTE — Assessment & Plan Note (Signed)
She is describing fast heart rate spells associated with neck and jaw discomfort.  Usually exertional, this seems like it is out of proportion.  She has documented A-fib, but I like to see if the 2 are combined or different.  Plan: 70 Zio patch monitor.

## 2022-08-09 NOTE — Assessment & Plan Note (Addendum)
Exertional jaw pain with dyspnea that relieves with rest.  Interestingly this is associated with tachycardia probably out of proportion with valve activity she is doing. Myoview last month was nonischemic with mildly reduced EF despite the fact that in June, TTE showed normal EF with normal wall motion.  I would like to exclude A-fib as etiology by ordering 7-day Zio patch monitor to see if we can capture her episodes of chest discomfort and potentially recurrence of A-fib.  If they correlate together then we would simply want to treat A-fib with a potential antiarrhythmic agent.  (AA)  Unusual that she would have had a nonischemic Myoview and still have symptoms that would be concerning for angina.  Also need to exclude recurrent anemia the patient has a history of anemia.  Plan: Check Zio patch monitor, CBC and iron panel with ferritin, B12 and folate In the off chance that she is indeed having either micro or macrovascular angina, we do not have BP room to use beta-blockers or calcium blockers, therefore we will start Ranexa 500 mg twice daily.

## 2022-08-09 NOTE — Assessment & Plan Note (Signed)
Weight loss recommended.  Unfortunate present, she is not able to exercise because of knee pain and also now jaw pain and dyspnea.  With recent Myoview being negative, last report showed that she again is having symptomatic anemia, would probably consider Coronary CTA for definitive evaluation to exclude macrovascular CAD.Marland Kitchen

## 2022-08-09 NOTE — Assessment & Plan Note (Signed)
Back in March 2023-noted to have symptomatic anemia with iron deficiency.  Transfused 2 units.  She remains on PPI and iron supplementation.  Plan: Check CBC with iron panel, B12-folate panel. If she is anemic, would defer back to PCP for further evaluation.  It seems like she has had a EGD and colonoscopy.  May want to consider hematology consultation.  Will hold off on recommending aspirin for CVA prophylaxis and A-fib.

## 2022-08-26 ENCOUNTER — Telehealth: Payer: Self-pay | Admitting: *Deleted

## 2022-08-26 NOTE — Telephone Encounter (Signed)
Left detail message on patient voicemail per DPR- of results , any question may call back , keep appointment 10/03/22.

## 2022-08-26 NOTE — Telephone Encounter (Signed)
-----   Message from Marykay Lex, MD sent at 08/24/2022 10:50 PM EDT ----- 6-Day Zio Patch Monitor April 2024   Predominant underlying rhythm is sinus rhythm: Heart rate range 41 138 bpm.  Average 82 bpm.   Rare PACs and PVCs with rare couplets and triplets.  No bigeminy or trigeminy.   1% A-fib burden-heart rate range 87 to 210 bpm.  Longest episode was 1 hour and 35 minutes.  (Occurred on 08/10/2022 at roughly 1-4 PM)   13 atrial runs: Fastest and longest was 6 beats at a max rate of 158 bpm.   Symptoms are often noted with sinus rhythm plus or minus PACs or PVCs.  A-fib was also noted.     Not sure if the extent of symptoms is related to atrial fibrillation.  The atrial fibrillation burden is not all that significant.   Need to get the question of anemia answered before determining whether or not we use aspirin or anticoagulant for stroke prevention with atrial fibrillation.     Bryan Lemma, MD

## 2022-10-03 ENCOUNTER — Telehealth: Payer: Self-pay

## 2022-10-03 ENCOUNTER — Encounter: Payer: Self-pay | Admitting: Cardiology

## 2022-10-03 ENCOUNTER — Ambulatory Visit: Payer: Managed Care, Other (non HMO) | Attending: Internal Medicine | Admitting: Cardiology

## 2022-10-03 DIAGNOSIS — R002 Palpitations: Secondary | ICD-10-CM

## 2022-10-03 DIAGNOSIS — I2089 Other forms of angina pectoris: Secondary | ICD-10-CM | POA: Diagnosis not present

## 2022-10-03 DIAGNOSIS — D649 Anemia, unspecified: Secondary | ICD-10-CM | POA: Diagnosis not present

## 2022-10-03 DIAGNOSIS — I480 Paroxysmal atrial fibrillation: Secondary | ICD-10-CM | POA: Diagnosis not present

## 2022-10-03 NOTE — Assessment & Plan Note (Addendum)
1% A-fib burden on monitor.  Pretty much asymptomatic when she had it.  It does not appear that the jaw-neck pain symptoms are related to atrial fibs based on the frequency of symptoms and the infrequency of A-fib.  Based on the fact that she has relatively asymptomatic, short runs of A-fib, in favor of expectant management to avoid medication for rate and rhythm control.  Her heart rates did go fast, when in A-fib, but not at baseline. If she does have tachycardia symptoms we could consider adding low-dose diltiazem as this would be the least likely cause of any hypotension issues.  (BP today was low).  Would not want to do this until we have the anemia issue resolved.  Again holding off on DOAC based on GI bleed and CHA2DS2-VASc score of 1

## 2022-10-03 NOTE — Assessment & Plan Note (Signed)
She described having fast heart rates but spells associated with jaw and neck pain => monitor showed short little bursts of less than 6 PACs that are probably primarily old A-fib bursts, and only 1% A-fib and that longest was a hour and a half.  She did not necessarily notice that and did not push the button for any of the tachycardia spells.  She pushed the button for sinus rhythm and PVCs.  At this point all notably would benefit from treating with a standing medication especially with evidence of fatigue issues.  Relatively reassuring monitor results.

## 2022-10-03 NOTE — Assessment & Plan Note (Signed)
At this point, with the cardiac evaluation being relatively normal, she is going to pursue the anemia to explain her exertional dyspnea and jaw pain.  Due to see her PCP next week.  Anticipate referral back to the GI doctor.  Again, with concern for anemia, hold off on DOAC.

## 2022-10-03 NOTE — Patient Instructions (Signed)
Medication Instructions:  Your physician recommends that you continue on your current medications as directed. Please refer to the Current Medication list given to you today.  Were not going to make any changes today, we will let the patient discuss things over with her (PCP) and then if she wants to try the Ranexa then the patient will reach out to [place].  *If you need a refill on your cardiac medications before your next appointment, please call your pharmacy*   Lab Work: NONE If you have labs (blood work) drawn today and your tests are completely normal, you will receive your results only by: MyChart Message (if you have MyChart) OR A paper copy in the mail If you have any lab test that is abnormal or we need to change your treatment, we will call you to review the results.   Testing/Procedures: NONE   Follow-Up: At [facility], you and your health needs are our priority.  As part of our continuing mission to provide you with exceptional heart care, we have created designated Provider Care Teams.  These Care Teams include your primary Cardiologist (physician) and Advanced Practice Providers (APPs -  Physician Assistants and Nurse Practitioners) who all work together to provide you with the care you need, when you need it.  We recommend signing up for the patient portal called "MyChart".  Sign up information is provided on this After Visit Summary.  MyChart is used to connect with patients for Virtual Visits (Telemedicine).  Patients are able to view lab/test results, encounter notes, upcoming appointments, etc.  Non-urgent messages can be sent to your provider as well.   To learn more about what you can do with MyChart, go to [email or web address].    Your next appointment:   6 month(s)  Provider:   Bryan Lemma, MD

## 2022-10-03 NOTE — Assessment & Plan Note (Signed)
Exertional jaw pain with a nonischemic Myoview.  At this point she wants to pursue the evaluation for her anemia.  If symptoms were to persist we could consider the possibility of evaluating for microvascular ischemia which would be a potential invasive testing protocol. I did give her a prescription for Ranexa and she did not yet fill it. I recommended if she is not feeling any better once her anemia is treated she should give it a try as that would be the best possible option for her with her borderline low blood pressures at baseline.

## 2022-10-03 NOTE — Progress Notes (Signed)
Virtual Visit via Video Note   Because of Margaret Hansen's co-morbid illnesses, she is at least at moderate risk for complications without adequate follow up.  This format is felt to be most appropriate for this patient at this time.  All issues noted in this document were discussed and addressed.  A limited physical exam was performed with this format.  Please refer to the patient's chart for her consent to telehealth for [facility].  Patient has given verbal permission to conduct this visit via virtual appointment and to bill insurance 10/03/2022 10:17 PM     Evaluation Performed:  Follow-up visit  Date:  10/03/2022   ID:  Margaret Hansen, DOB 08-Jun-1960, MRN [id number]  Patient Location: Home Provider Location: Office/Clinic  PCP:  Irven Coe, MD  Cardiologist:  Bryan Lemma, MD  Electrophysiologist:  None   Chief Complaint:   Chief Complaint  Patient presents with   Follow-up    Discussed test results   ====================================  ASSESSMENT & PLAN:    Problem List Items Addressed This Visit       Cardiology Problems   Paroxysmal atrial fibrillation (HCC) - Primary (Chronic)    1% A-fib burden on monitor.  Pretty much asymptomatic when she had it.  It does not appear that the jaw-neck pain symptoms are related to atrial fibs based on the frequency of symptoms and the infrequency of A-fib.  Based on the fact that she has relatively asymptomatic, short runs of A-fib, in favor of expectant management to avoid medication for rate and rhythm control.  Her heart rates did go fast, when in A-fib, but not at baseline. If she does have tachycardia symptoms we could consider adding low-dose diltiazem as this would be the least likely cause of any hypotension issues.  (BP today was low).  Would not want to do this until we have the anemia issue resolved.  Again holding off on DOAC based on GI bleed and CHA2DS2-VASc score of 1      Atypical angina     Exertional jaw pain with a nonischemic Myoview.  At this point she wants to pursue the evaluation for her anemia.  If symptoms were to persist we could consider the possibility of evaluating for microvascular ischemia which would be a potential invasive testing protocol. I did give her a prescription for Ranexa and she did not yet fill it. I recommended if she is not feeling any better once her anemia is treated she should give it a try as that would be the best possible option for her with her borderline low blood pressures at baseline.         Other   Symptomatic anemia (Chronic)    At this point, with the cardiac evaluation being relatively normal, she is going to pursue the anemia to explain her exertional dyspnea and jaw pain.  Due to see her PCP next week.  Anticipate referral back to the GI doctor.  Again, with concern for anemia, hold off on DOAC.      Rapid palpitations    She described having fast heart rates but spells associated with jaw and neck pain => monitor showed short little bursts of less than 6 PACs that are probably primarily old A-fib bursts, and only 1% A-fib and that longest was a hour and a half.  She did not necessarily notice that and did not push the button for any of the tachycardia spells.  She pushed the button for sinus rhythm  and PVCs.  At this point all notably would benefit from treating with a standing medication especially with evidence of fatigue issues.  Relatively reassuring monitor results.       ====================================  History of Present Illness:    Margaret Hansen is a 62 y.o. female with PMH notable for RECURRENT ANEMIA (l iron deficiency, status post PRBC transfusion), OSA-CPAP associated with PAF who presents via audio/video conferencing for a telehealth visit today as a Follow-up to discuss test results of Zio patch monitor.  PMH: History of SymptomaticAnemia s/p transfusion 06/28/21: Admitted with hemoglobin of 5.8-2  units PRBC, EGD normal.    Paroxysmal Atrial Fibrillation: CHA2DS2-VASc score 1 (female).  Presumably was asymptomatic with tachycardia. 09/10/2021: Had tachycardia during colonoscopy => sent to ER after EKG noted A-fib RVR with rate 147 bpm.  No prior history.  Resolved spontaneously after short acting IV medication prior to arrival to ER. Not on DOAC because of concerns for GI bleed. CV Evaluation to date: TTE 10/07/2021: EF 60 to 65%.  Normal LV size and function.  No RWMA.  GR 2 DD.  Moderate LA dilation.  Normal PAP.  Normal valves.  Normal RAP. Myoview 06/2022:  Normal / Low Risk -> EF 45-55%. ? cannot exclude apical hypokinesis. Zio patch April 2024 (see below): 1% A-fib burden heart range 87 to 10 bpm.  Longest was 1 hour 35 minute (April 14 at roughly 1 to 4 PM).  Short atrial runs of 6 beats. < 1% PACs and PVCs. OSA-usually on CPAP, Morbid Obesity Depression-on Cymbalta  RAKHIA STEYER was seen August 06, 2022 at the request of Rudi Coco, NP from a A-fib clinic -> in order to establish "primary cardiologist".  We discussed symptoms of chest/jaw pain and exertional dyspnea.  She had just had an echocardiogram Myoview reviewed above that were relatively normal.  Nonischemic, with normal EF.  She has been having reproducible jaw pain over the last month or so worse with exertion.  Barely able to walk in the neighborhood.  Occurring with less notable exertion, and increased frequency and intensity.  Having to stop to rest.  She was unclear if this was associated with rapid irregular heartbeats but clearly her heart rate was up with exertion.  Also noted exercise intolerance related partly to her knee pain. => Based on relatively normal echo and Myoview, I decided to evaluate with a Zio patch monitor to determine if she was potentially having intermittent A-fib causing her symptoms.  I also presented to check a CBC based on her history of anemia.  Hospitalizations:  None   Recent - Interim  CV studies:   The following studies were reviewed today: 6-Day Zio Patch Monitor April 2024   Predominant underlying rhythm is sinus rhythm: Heart rate range 41 138 bpm.  Average 82 bpm.   Rare PACs and PVCs with rare couplets and triplets.  No bigeminy or trigeminy.   1% A-fib burden-heart rate range 87 to 210 bpm.  Longest episode was 1 hour and 35 minutes.  (Occurred on 08/10/2022 at roughly 1-4 PM)   13 atrial runs: Fastest and longest was 6 beats at a max rate of 158 bpm.   Symptoms are often noted with sinus rhythm plus or minus PACs or PVCs.  A-fib was also noted.   Not sure if the extent of symptoms is related to atrial fibrillation.  The atrial fibrillation burden is not all that significant.   Need to get the question of anemia answered before  determining whether or not we use aspirin or anticoagulant for stroke prevention with atrial fibrillation.  Myoview 07/15/2022:    The study is normal. The study is low risk.   No ST deviation was noted.   LV perfusion is normal.   Left ventricular function is abnormal. Global function is mildly reduced. Nuclear stress EF: 48 %. The left ventricular ejection fraction is mildly decreased (45-54%). End diastolic cavity size is normal. End systolic cavity size is normal.   No ischemia or infarction EF estimated at 48% with apical hypokinesis but myocardial contours appear inaccurate EF normal by TTE in 2023 suggest MRI/TTE correlation Low risk study normal perfusion   Inerval History   SABRIE MENZIES seems to be doing okay since I saw her.  She is due to see her PCP the follow-up her anemia early next week.  She does not recall whether or not she noticed being in the A-fib for a few hours following the monitor.  She did have symptoms but overall things seem to have stabilized and she thinks that she would like to go to the anemia evaluation based on the labs are checked.  She still has some jaw pain but not as prominent.  More notable for  the dyspnea.  Exercise intolerance but overall she seems to be more content with having a relatively clear picture that cardiac etiologies have been essentially ruled out. She really is not noticing the rapid heart rate spells anymore.  Cardiovascular ROS: positive for - chest pain, dyspnea on exertion, palpitations, and is more jaw pain and chest pain.  Exertional symptoms, not at rest.  Better than last visit. negative for - loss of consciousness, orthopnea, palpitations, paroxysmal nocturnal dyspnea, rapid heart rate, shortness of breath, or syncope or near syncope TIA/RCVS or claudication.  No notable edema.   ROS:  Please see the history of present illness.     Essentially positive as noted above: Still has pain in her right knee as well as back and neck.  Seems to be less anxious. No recent illnesses or fevers chills cold or sweats. No melena, hematochezia or hematuria.  Past Medical History:  Diagnosis Date   Depression    GERD (gastroesophageal reflux disease)    Iron deficiency anemia due to chronic blood loss 06/2021   Admitted with hemoglobin of 5.8-2 units PRBC, EGD normal.   Past Surgical History:  Procedure Laterality Date   CESAREAN SECTION     CHOLECYSTECTOMY     ESOPHAGOGASTRODUODENOSCOPY (EGD) WITH PROPOFOL N/A 06/29/2021   Procedure: ESOPHAGOGASTRODUODENOSCOPY (EGD) WITH PROPOFOL;  Surgeon: Jaynie Collins, DO;  Location: [facility] ENDOSCOPY;  Service: Gastroenterology;  Laterality: N/A;   NM MYOVIEW LTD  06/2022   Lexiscan: Normal / Low Risk -> EF 45-55%. ? cannot exclude apical hypokinesis.   TRANSTHORACIC ECHOCARDIOGRAM  10/07/2021   EF 60 to 65%.  Normal LV size and function.  No RWMA.  GR 2 DD.  Moderate LA dilation.  Normal PAP.  Normal valves.  Normal RAP.     Current Meds  Medication Sig   acetaminophen (TYLENOL) 500 MG tablet Take 500-1,000 mg by mouth every 6 (six) hours as needed.   DULoxetine (CYMBALTA) 30 MG capsule Take 30 mg by mouth daily.    ferrous sulfate 325 (65 FE) MG tablet Take 1 tablet (325 mg total) by mouth daily with breakfast.   meclizine (ANTIVERT) 25 MG tablet Take 25 mg by mouth every 12 (twelve) hours as needed for dizziness.   pantoprazole (  PROTONIX) 40 MG tablet Take 40 mg by mouth daily.   polyethylene glycol powder (GLYCOLAX/MIRALAX) 17 GM/SCOOP powder Take 17 g by mouth daily. Take as needed to produce 1 normal bowel movement per day.   triamcinolone cream (KENALOG) 0.1 % Apply 1 application. topically 2 (two) times daily.     Allergies:   Patient has no known allergies.   Social History   Tobacco Use   Smoking status: Former   Smokeless tobacco: Never   Tobacco comments:    Former smoker 09/17/21  Substance Use Topics   Alcohol use: Not Currently   Drug use: Never     Family Hx: The patient's family history is not on file.   Labs/Other Tests and Data Reviewed:    EKG:  No ECG reviewed.  Recent Labs: 08/06/2022: Hemoglobin 9.9; Platelets 383   Recent Lipid Panel No results found for: "CHOL", "TRIG", "HDL", "CHOLHDL", "LDLCALC", "LDLDIRECT"  Wt Readings from Last 3 Encounters:  08/06/22 242 lb (109.8 kg)  07/01/22 242 lb 3.2 oz (109.9 kg)  06/29/22 231 lb 7.7 oz (105 kg)     Objective:    Vital Signs:  There were no vitals taken for this visit.  VITAL SIGNS:  reviewed RESPIRATORY:  normal respiratory effort, symmetric expansion NEURO:  alert and oriented x 3, no obvious focal deficit  ==========================================  COVID-19 Education: The signs and symptoms of COVID-19 were discussed with the patient and how to seek care for testing (follow up with PCP or arrange E-visit).   The importance of social distancing was discussed today.  Time:   Today, I have spent 16 minutes with the patient with telehealth technology discussing the above problems.   An additional 13 minutes spent charting (reviewing prior notes, hospital records, studies, labs etc.) Total 29  minutes   Medication Adjustments/Labs and Tests Ordered: Current medicines are reviewed at length with the patient today.  Concerns regarding medicines are outlined above.   Patient Instructions  Medication Instructions:  Your physician recommends that you continue on your current medications as directed. Please refer to the Current Medication list given to you today.  Were not going to make any changes today, we will let the patient discuss things over with her (PCP) and then if she wants to try the Ranexa then the patient will reach out to [place].  *If you need a refill on your cardiac medications before your next appointment, please call your pharmacy*   Lab Work: NONE If you have labs (blood work) drawn today and your tests are completely normal, you will receive your results only by: MyChart Message (if you have MyChart) OR A paper copy in the mail If you have any lab test that is abnormal or we need to change your treatment, we will call you to review the results.   Testing/Procedures: NONE   Follow-Up: At [facility], you and your health needs are our priority.  As part of our continuing mission to provide you with exceptional heart care, we have created designated Provider Care Teams.  These Care Teams include your primary Cardiologist (physician) and Advanced Practice Providers (APPs -  Physician Assistants and Nurse Practitioners) who all work together to provide you with the care you need, when you need it.  We recommend signing up for the patient portal called "MyChart".  Sign up information is provided on this After Visit Summary.  MyChart is used to connect with patients for Virtual Visits (Telemedicine).  Patients are able to view lab/test  results, encounter notes, upcoming appointments, etc.  Non-urgent messages can be sent to your provider as well.   To learn more about what you can do with MyChart, go to [email or web address].    Your next appointment:   6  month(s)  Provider:   Bryan Lemma, MD      Signed, Bryan Lemma, MD  10/03/2022 10:17 PM     [facility]

## 2022-10-03 NOTE — Telephone Encounter (Signed)
  Patient Consent for Virtual Visit        Margaret Hansen has provided verbal consent on 10/03/2022 for a virtual visit (video or telephone).   CONSENT FOR VIRTUAL VISIT FOR:  Margaret Hansen  By participating in this virtual visit I agree to the following:  I hereby voluntarily request, consent and authorize [facility] and its employed or contracted physicians, physician assistants, nurse practitioners or other licensed health care professionals (the Practitioner), to provide me with telemedicine health care services (the "Services") as deemed necessary by the treating Practitioner. I acknowledge and consent to receive the Services by the Practitioner via telemedicine. I understand that the telemedicine visit will involve communicating with the Practitioner through live audiovisual communication technology and the disclosure of certain medical information by electronic transmission. I acknowledge that I have been given the opportunity to request an in-person assessment or other available alternative prior to the telemedicine visit and am voluntarily participating in the telemedicine visit.  I understand that I have the right to withhold or withdraw my consent to the use of telemedicine in the course of my care at any time, without affecting my right to future care or treatment, and that the Practitioner or I may terminate the telemedicine visit at any time. I understand that I have the right to inspect all information obtained and/or recorded in the course of the telemedicine visit and may receive copies of available information for a reasonable fee.  I understand that some of the potential risks of receiving the Services via telemedicine include:  Delay or interruption in medical evaluation due to technological equipment failure or disruption; Information transmitted may not be sufficient (e.g. poor resolution of images) to allow for appropriate medical decision making by the  Practitioner; and/or  In rare instances, security protocols could fail, causing a breach of personal health information.  Furthermore, I acknowledge that it is my responsibility to provide information about my medical history, conditions and care that is complete and accurate to the best of my ability. I acknowledge that Practitioner's advice, recommendations, and/or decision may be based on factors not within their control, such as incomplete or inaccurate data provided by me or distortions of diagnostic images or specimens that may result from electronic transmissions. I understand that the practice of medicine is not an exact science and that Practitioner makes no warranties or guarantees regarding treatment outcomes. I acknowledge that a copy of this consent can be made available to me via my patient portal [facility] MyChart), or I can request a printed copy by calling the office of [facility].    I understand that my insurance will be billed for this visit.   I have read or had this consent read to me. I understand the contents of this consent, which adequately explains the benefits and risks of the Services being provided via telemedicine.  I have been provided ample opportunity to ask questions regarding this consent and the Services and have had my questions answered to my satisfaction. I give my informed consent for the services to be provided through the use of telemedicine in my medical care

## 2022-11-27 ENCOUNTER — Other Ambulatory Visit: Payer: Self-pay | Admitting: Family Medicine

## 2022-11-27 DIAGNOSIS — Z1231 Encounter for screening mammogram for malignant neoplasm of breast: Secondary | ICD-10-CM

## 2022-12-04 ENCOUNTER — Ambulatory Visit
Admission: RE | Admit: 2022-12-04 | Discharge: 2022-12-04 | Disposition: A | Discharge status: Home / Self Care | Payer: Managed Care, Other (non HMO) | Source: Ambulatory Visit | Attending: Family Medicine | Admitting: Family Medicine

## 2022-12-04 DIAGNOSIS — Z1231 Encounter for screening mammogram for malignant neoplasm of breast: Secondary | ICD-10-CM | POA: Insufficient documentation

## 2022-12-08 ENCOUNTER — Encounter: Payer: Self-pay | Admitting: Family Medicine

## 2022-12-09 ENCOUNTER — Other Ambulatory Visit: Payer: Self-pay | Admitting: Family Medicine

## 2022-12-09 DIAGNOSIS — N6489 Other specified disorders of breast: Secondary | ICD-10-CM

## 2022-12-09 DIAGNOSIS — R928 Other abnormal and inconclusive findings on diagnostic imaging of breast: Secondary | ICD-10-CM

## 2022-12-10 ENCOUNTER — Ambulatory Visit
Admission: RE | Admit: 2022-12-10 | Discharge: 2022-12-10 | Disposition: A | Discharge status: Home / Self Care | Payer: Managed Care, Other (non HMO) | Source: Ambulatory Visit | Attending: Family Medicine | Admitting: Family Medicine

## 2022-12-10 DIAGNOSIS — R928 Other abnormal and inconclusive findings on diagnostic imaging of breast: Secondary | ICD-10-CM | POA: Insufficient documentation

## 2022-12-10 DIAGNOSIS — N6489 Other specified disorders of breast: Secondary | ICD-10-CM | POA: Insufficient documentation

## 2023-06-02 NOTE — Progress Notes (Signed)
PUO

## 2023-08-25 ENCOUNTER — Other Ambulatory Visit: Payer: Self-pay | Admitting: Student

## 2023-08-25 DIAGNOSIS — D649 Anemia, unspecified: Secondary | ICD-10-CM

## 2023-09-04 ENCOUNTER — Other Ambulatory Visit

## 2023-09-23 ENCOUNTER — Encounter: Payer: Self-pay | Admitting: Oncology

## 2023-09-23 ENCOUNTER — Inpatient Hospital Stay: Attending: Oncology | Admitting: Oncology

## 2023-09-23 ENCOUNTER — Inpatient Hospital Stay

## 2023-09-23 VITALS — BP 98/64 | HR 140 | Temp 98.9°F | Resp 17 | Wt 244.0 lb

## 2023-09-23 DIAGNOSIS — Z9049 Acquired absence of other specified parts of digestive tract: Secondary | ICD-10-CM | POA: Insufficient documentation

## 2023-09-23 DIAGNOSIS — Z803 Family history of malignant neoplasm of breast: Secondary | ICD-10-CM | POA: Diagnosis not present

## 2023-09-23 DIAGNOSIS — F32A Depression, unspecified: Secondary | ICD-10-CM | POA: Diagnosis not present

## 2023-09-23 DIAGNOSIS — Z87891 Personal history of nicotine dependence: Secondary | ICD-10-CM | POA: Insufficient documentation

## 2023-09-23 DIAGNOSIS — Z79899 Other long term (current) drug therapy: Secondary | ICD-10-CM | POA: Diagnosis not present

## 2023-09-23 DIAGNOSIS — D649 Anemia, unspecified: Secondary | ICD-10-CM | POA: Insufficient documentation

## 2023-09-23 NOTE — Progress Notes (Signed)
 [facility]  [phone] [phone]) [phone]  ID: Margaret Hansen OB: 1960/06/02  MR#: [id number]  AOZ#:[id number]  Patient Care Team: Benedetto Brady, MD as PCP - General (Family Medicine) Arleen Lacer, MD as PCP - Cardiology (Cardiology) Shellie Dials, MD as Consulting Physician (Hematology and Oncology)  CHIEF COMPLAINT: Anemia, unspecified.  INTERVAL HISTORY: Patient is a 63 year old female with a history of iron  deficiency anemia who was referred for further evaluation of a declining hemoglobin.  She currently feels well and is asymptomatic.  She does not complain of any weakness or fatigue.  She has a good appetite and denies weight loss.  She has no neurologic complaints.  She denies any recent fevers or illnesses.  She has no chest pain, shortness of breath, cough, or hemoptysis.  She denies any nausea, vomiting, constipation, or diarrhea.  She has not melena or hematochezia.  She has no urinary complaints.  Patient offers no specific complaints today.  REVIEW OF SYSTEMS:   Review of Systems  Constitutional: Negative.  Negative for fever, malaise/fatigue and weight loss.  Respiratory: Negative.  Negative for cough, hemoptysis and shortness of breath.   Cardiovascular: Negative.  Negative for chest pain and leg swelling.  Gastrointestinal: Negative.  Negative for abdominal pain, blood in stool and melena.  Genitourinary: Negative.  Negative for dysuria.  Musculoskeletal:  Negative for back pain.  Skin: Negative.  Negative for rash.  Neurological: Negative.  Negative for dizziness, focal weakness and weakness.  Psychiatric/Behavioral: Negative.  The patient is not nervous/anxious.     As per HPI. Otherwise, a complete review of systems is negative.  PAST MEDICAL HISTORY: Past Medical History:  Diagnosis Date   Depression    GERD (gastroesophageal reflux disease)    Iron  deficiency anemia due to chronic blood loss 06/2021   Admitted with  hemoglobin of 5.8-2 units PRBC, EGD normal.    PAST SURGICAL HISTORY: Past Surgical History:  Procedure Laterality Date   CESAREAN SECTION     CHOLECYSTECTOMY     ESOPHAGOGASTRODUODENOSCOPY (EGD) WITH PROPOFOL  N/A 06/29/2021   Procedure: ESOPHAGOGASTRODUODENOSCOPY (EGD) WITH PROPOFOL ;  Surgeon: Quintin Buckle, DO;  Location: [facility] ENDOSCOPY;  Service: Gastroenterology;  Laterality: N/A;   NM MYOVIEW  LTD  06/2022   Lexiscan : Normal / Low Risk -> EF 45-55%. ? cannot exclude apical hypokinesis.   TRANSTHORACIC ECHOCARDIOGRAM  10/07/2021   EF 60 to 65%.  Normal LV size and function.  No RWMA.  GR 2 DD.  Moderate LA dilation.  Normal PAP.  Normal valves.  Normal RAP.    FAMILY HISTORY: Family History  Problem Relation Age of Onset   Breast cancer Sister 18    ADVANCED DIRECTIVES (Y/N):  N  HEALTH MAINTENANCE: Social History   Tobacco Use   Smoking status: Former   Smokeless tobacco: Never   Tobacco comments:    Former smoker 09/17/21  Substance Use Topics   Alcohol use: Not Currently   Drug use: Never     Colonoscopy:  PAP:  Bone density:  Lipid panel:  No Known Allergies  Current Outpatient Medications  Medication Sig Dispense Refill   acetaminophen  (TYLENOL ) 500 MG tablet Take 500-1,000 mg by mouth every 6 (six) hours as needed.     DULoxetine  (CYMBALTA ) 30 MG capsule Take 30 mg by mouth daily.     ferrous sulfate  325 (65 FE) MG tablet Take 1 tablet (325 mg total) by mouth daily with breakfast. 30 tablet 1   pantoprazole  (PROTONIX ) 40 MG tablet  Take 40 mg by mouth daily.  12   No current facility-administered medications for this visit.    OBJECTIVE: Vitals:   09/23/23 1345  BP: 98/64  Pulse: (!) 140  Resp: 17  Temp: 98.9 F (37.2 C)  SpO2: 94%     Body mass index is 49.28 kg/m.    ECOG FS:0 - Asymptomatic  General: Well-developed, well-nourished, no acute distress. Eyes: Pink conjunctiva, anicteric sclera. HEENT: Normocephalic, moist mucous  membranes. Lungs: No audible wheezing or coughing. Heart: Regular rate and rhythm. Abdomen: Soft, nontender, no obvious distention. Musculoskeletal: No edema, cyanosis, or clubbing. Neuro: Alert, answering all questions appropriately. Cranial nerves grossly intact. Skin: No rashes or petechiae noted. Psych: Normal affect. Lymphatics: No cervical, calvicular, axillary or inguinal LAD.   LAB RESULTS:  Lab Results  Component Value Date   NA 145 09/10/2021   K 3.9 09/10/2021   CL 111 09/10/2021   CO2 26 09/10/2021   GLUCOSE 92 09/10/2021   BUN 10 09/10/2021   CREATININE 0.77 09/10/2021   CALCIUM 8.5 (L) 09/10/2021   PROT 6.4 (L) 06/28/2021   ALBUMIN 2.9 (L) 06/28/2021   AST 12 (L) 06/28/2021   ALT 9 06/28/2021   ALKPHOS 72 06/28/2021   BILITOT 0.4 06/28/2021   GFRNONAA >60 09/10/2021   GFRAA  10/20/2007    >60        The eGFR has been calculated using the MDRD equation. This calculation has not been validated in all clinical    Lab Results  Component Value Date   WBC 10.8 08/06/2022   NEUTROABS 5.3 06/28/2021   HGB 9.9 (L) 08/06/2022   HCT 34.2 08/06/2022   MCV 72 (L) 08/06/2022   PLT 383 08/06/2022     STUDIES: No results found.  ASSESSMENT: Anemia, unspecified.  PLAN:    Anemia, unspecified: Patient noted to have a history of iron  deficiency anemia and continues to take oral iron  supplementation.  She gets all of her lab work at [facility] and was given a prescription today for lab draw in the next 1 to 2 days.  Return to clinic in 3 weeks for discussion of her results and consideration of IV iron  if her iron  stores remain decreased.  I spent a total of 45 minutes reviewing chart data, face-to-face evaluation with the patient, counseling and coordination of care as detailed above.   Patient expressed understanding and was in agreement with this plan. She also understands that She can call clinic at any time with any questions, concerns, or complaints.      Shellie Dials, MD   09/23/2023 3:42 PM

## 2023-10-14 ENCOUNTER — Inpatient Hospital Stay: Attending: Oncology | Admitting: Oncology

## 2023-10-14 ENCOUNTER — Inpatient Hospital Stay

## 2023-10-14 ENCOUNTER — Encounter: Payer: Self-pay | Admitting: Oncology

## 2023-10-14 VITALS — BP 104/56 | HR 70 | Temp 97.8°F | Resp 18

## 2023-10-14 VITALS — BP 100/50 | HR 72 | Temp 97.7°F | Resp 18 | Ht 59.0 in | Wt 239.3 lb

## 2023-10-14 DIAGNOSIS — D509 Iron deficiency anemia, unspecified: Secondary | ICD-10-CM | POA: Diagnosis present

## 2023-10-14 DIAGNOSIS — D472 Monoclonal gammopathy: Secondary | ICD-10-CM | POA: Diagnosis not present

## 2023-10-14 MED ORDER — IRON SUCROSE 20 MG/ML IV SOLN
200.0000 mg | Freq: Once | INTRAVENOUS | Status: AC
  Administered 2023-10-14: 200 mg via INTRAVENOUS

## 2023-10-14 MED ORDER — SODIUM CHLORIDE 0.9% FLUSH
10.0000 mL | Freq: Once | INTRAVENOUS | Status: AC | PRN
  Administered 2023-10-14: 10 mL
  Filled 2023-10-14: qty 10

## 2023-10-14 NOTE — Progress Notes (Signed)
 [facility]  [phone] [phone]  ID: Margaret Hansen OB: [birth year]  MR#: [id number]  AOZ#:[id number]  Patient Care Team: Benedetto Brady, MD as PCP - General (Family Medicine) Arleen Lacer, MD as PCP - Cardiology (Cardiology) Shellie Dials, MD as Consulting Physician (Hematology and Oncology)  CHIEF COMPLAINT: Iron  deficiency anemia.    INTERVAL HISTORY: Patient returns to clinic today for further evaluation, discussion of her laboratory results, and IV Venofer .  She continues to have fatigue, but otherwise feels well.  She has a good appetite and denies weight loss.  She has no neurologic complaints.  She denies any recent fevers or illnesses.  She has no chest pain, shortness of breath, cough, or hemoptysis.  She denies any nausea, vomiting, constipation, or diarrhea.  She has not melena or hematochezia.  She has no urinary complaints.  Patient offers no further specific complaints today.  REVIEW OF SYSTEMS:   Review of Systems  Constitutional:  Positive for malaise/fatigue. Negative for fever and weight loss.  Respiratory: Negative.  Negative for cough, hemoptysis and shortness of breath.   Cardiovascular: Negative.  Negative for chest pain and leg swelling.  Gastrointestinal: Negative.  Negative for abdominal pain, blood in stool and melena.  Genitourinary: Negative.  Negative for dysuria.  Musculoskeletal:  Negative for back pain.  Skin: Negative.  Negative for rash.  Neurological: Negative.  Negative for dizziness, focal weakness and weakness.  Psychiatric/Behavioral: Negative.  The patient is not nervous/anxious.     As per HPI. Otherwise, a complete review of systems is negative.  PAST MEDICAL HISTORY: Past Medical History:  Diagnosis Date   Depression    GERD (gastroesophageal reflux disease)    Iron  deficiency anemia due to chronic blood loss 06/2021   Admitted with hemoglobin of 5.8-2 units PRBC, EGD normal.    PAST  SURGICAL HISTORY: Past Surgical History:  Procedure Laterality Date   CESAREAN SECTION     CHOLECYSTECTOMY     ESOPHAGOGASTRODUODENOSCOPY (EGD) WITH PROPOFOL  N/A 06/29/2021   Procedure: ESOPHAGOGASTRODUODENOSCOPY (EGD) WITH PROPOFOL ;  Surgeon: Quintin Buckle, DO;  Location: [facility] ENDOSCOPY;  Service: Gastroenterology;  Laterality: N/A;   NM MYOVIEW  LTD  06/2022   Lexiscan : Normal / Low Risk -> EF 45-55%. ? cannot exclude apical hypokinesis.   TRANSTHORACIC ECHOCARDIOGRAM  10/07/2021   EF 60 to 65%.  Normal LV size and function.  No RWMA.  GR 2 DD.  Moderate LA dilation.  Normal PAP.  Normal valves.  Normal RAP.    FAMILY HISTORY: Family History  Problem Relation Age of Onset   Breast cancer Sister 8    ADVANCED DIRECTIVES (Y/N):  N  HEALTH MAINTENANCE: Social History   Tobacco Use   Smoking status: Former   Smokeless tobacco: Never   Tobacco comments:    Former smoker 09/17/21  Substance Use Topics   Alcohol use: Not Currently   Drug use: Never     Colonoscopy:  PAP:  Bone density:  Lipid panel:  No Known Allergies  Current Outpatient Medications  Medication Sig Dispense Refill   acetaminophen  (TYLENOL ) 500 MG tablet Take 500-1,000 mg by mouth every 6 (six) hours as needed.     DULoxetine  (CYMBALTA ) 30 MG capsule Take 30 mg by mouth daily.     ferrous sulfate  325 (65 FE) MG tablet Take 1 tablet (325 mg total) by mouth daily with breakfast. 30 tablet 1   pantoprazole  (PROTONIX ) 40 MG tablet Take 40 mg by mouth daily.  12  No current facility-administered medications for this visit.    OBJECTIVE: Vitals:   10/14/23 1327  BP: (!) 100/50  Pulse: 72  Resp: 18  Temp: 97.7 F (36.5 C)  SpO2: 100%     Body mass index is 48.33 kg/m.    ECOG FS:0 - Asymptomatic  General: Well-developed, well-nourished, no acute distress. Eyes: Pink conjunctiva, anicteric sclera. HEENT: Normocephalic, moist mucous membranes. Lungs: No audible wheezing or  coughing. Heart: Regular rate and rhythm. Abdomen: Soft, nontender, no obvious distention. Musculoskeletal: No edema, cyanosis, or clubbing. Neuro: Alert, answering all questions appropriately. Cranial nerves grossly intact. Skin: No rashes or petechiae noted. Psych: Normal affect.  LAB RESULTS:  Lab Results  Component Value Date   NA 145 09/10/2021   K 3.9 09/10/2021   CL 111 09/10/2021   CO2 26 09/10/2021   GLUCOSE 92 09/10/2021   BUN 10 09/10/2021   CREATININE 0.77 09/10/2021   CALCIUM 8.5 (L) 09/10/2021   PROT 6.4 (L) 06/28/2021   ALBUMIN 2.9 (L) 06/28/2021   AST 12 (L) 06/28/2021   ALT 9 06/28/2021   ALKPHOS 72 06/28/2021   BILITOT 0.4 06/28/2021   GFRNONAA >60 09/10/2021   GFRAA  10/20/2007    >60        The eGFR has been calculated using the MDRD equation. This calculation has not been validated in all clinical    Lab Results  Component Value Date   WBC 10.8 08/06/2022   NEUTROABS 5.3 06/28/2021   HGB 9.9 (L) 08/06/2022   HCT 34.2 08/06/2022   MCV 72 (L) 08/06/2022   PLT 383 08/06/2022     STUDIES: No results found.  ASSESSMENT: Iron  deficiency anemia.  PLAN:    Iron  deficiency anemia: Patient's labs completed at [facility].  Hemoglobin 11.2, total iron  21, iron  saturation 6%, ferritin 40.  All of her other laboratory work was either negative or within normal limits.  Proceed with 200 mg IV Venofer  today.  Return to clinic in 1 week for second infusion.  Patient will then return to clinic in 3 months with repeat laboratory work, further evaluation, and consideration of additional IV Venofer  if necessary.   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  Repeat SPEP with next lab draw.  I spent a total of 30 minutes reviewing chart data, face-to-face evaluation with the patient, counseling and coordination of care as detailed above.   Patient expressed understanding and was in agreement with this plan. She also understands that She can call  clinic at any time with any questions, concerns, or complaints.     Shellie Dials, MD   10/14/2023 2:14 PM

## 2023-10-21 ENCOUNTER — Inpatient Hospital Stay

## 2023-10-21 VITALS — BP 104/60 | HR 72 | Temp 99.7°F | Resp 18

## 2023-10-21 DIAGNOSIS — D509 Iron deficiency anemia, unspecified: Secondary | ICD-10-CM | POA: Diagnosis not present

## 2023-10-21 MED ORDER — IRON SUCROSE 20 MG/ML IV SOLN
200.0000 mg | Freq: Once | INTRAVENOUS | Status: AC
  Administered 2023-10-21: 200 mg via INTRAVENOUS

## 2023-10-21 NOTE — Patient Instructions (Signed)

## 2023-10-27 ENCOUNTER — Encounter: Payer: Self-pay | Admitting: Oncology

## 2024-01-14 ENCOUNTER — Ambulatory Visit: Admitting: Oncology

## 2024-01-14 ENCOUNTER — Encounter: Payer: Self-pay | Admitting: Family Medicine

## 2024-01-14 ENCOUNTER — Ambulatory Visit

## 2024-01-18 ENCOUNTER — Inpatient Hospital Stay: Attending: Oncology | Admitting: Oncology

## 2024-01-18 ENCOUNTER — Inpatient Hospital Stay

## 2024-01-18 ENCOUNTER — Encounter: Payer: Self-pay | Admitting: Oncology

## 2024-01-18 VITALS — BP 102/62 | HR 63 | Temp 98.6°F | Resp 17 | Wt 223.4 lb

## 2024-01-18 VITALS — BP 106/66 | HR 64

## 2024-01-18 DIAGNOSIS — D509 Iron deficiency anemia, unspecified: Secondary | ICD-10-CM | POA: Diagnosis present

## 2024-01-18 MED ORDER — IRON SUCROSE 20 MG/ML IV SOLN
200.0000 mg | Freq: Once | INTRAVENOUS | Status: AC
  Administered 2024-01-18: 200 mg via INTRAVENOUS
  Filled 2024-01-18: qty 10

## 2024-01-18 MED ORDER — SODIUM CHLORIDE 0.9% FLUSH
10.0000 mL | Freq: Once | INTRAVENOUS | Status: AC | PRN
  Administered 2024-01-18: 10 mL
  Filled 2024-01-18: qty 10

## 2024-01-18 NOTE — Progress Notes (Signed)
 [facility]  [phone] Fax:(336) [phone]  ID: Margaret Hansen OB: Oct 12, 1960  MR#: [id number]  RDW#:[id number]  Patient Care Team: Margaret Cole, MD as PCP - General (Family Medicine) Margaret Hansen ORN, MD as PCP - Cardiology (Cardiology) Margaret Margaret PARAS, MD as Consulting Physician (Hematology and Oncology)  CHIEF COMPLAINT: Iron  deficiency anemia.    INTERVAL HISTORY: Patient returns to clinic today for further evaluation, discussion of her laboratory results, and consideration of additional IV Venofer .  She currently feels well and is asymptomatic.  She does not complain of weakness or fatigue today. She has a good appetite and denies weight loss.  She has no neurologic complaints.  She denies any recent fevers or illnesses.  She has no chest pain, shortness of breath, cough, or hemoptysis.  She denies any nausea, vomiting, constipation, or diarrhea.  She has not melena or hematochezia.  She has no urinary complaints.  Patient offers no specific complaints today.  REVIEW OF SYSTEMS:   Review of Systems  Constitutional: Negative.  Negative for fever, malaise/fatigue and weight loss.  Respiratory: Negative.  Negative for cough, hemoptysis and shortness of breath.   Cardiovascular: Negative.  Negative for chest pain and leg swelling.  Gastrointestinal: Negative.  Negative for abdominal pain, blood in stool and melena.  Genitourinary: Negative.  Negative for dysuria.  Musculoskeletal:  Negative for back pain.  Skin: Negative.  Negative for rash.  Neurological: Negative.  Negative for dizziness, focal weakness and weakness.  Psychiatric/Behavioral: Negative.  The patient is not nervous/anxious.     As per HPI. Otherwise, a complete review of systems is negative.  PAST MEDICAL HISTORY: Past Medical History:  Diagnosis Date   Depression    GERD (gastroesophageal reflux disease)    Iron  deficiency anemia due to chronic blood loss 06/2021   Admitted with  hemoglobin of 5.8-2 units PRBC, EGD normal.    PAST SURGICAL HISTORY: Past Surgical History:  Procedure Laterality Date   CESAREAN SECTION     CHOLECYSTECTOMY     ESOPHAGOGASTRODUODENOSCOPY (EGD) WITH PROPOFOL  N/A 06/29/2021   Procedure: ESOPHAGOGASTRODUODENOSCOPY (EGD) WITH PROPOFOL ;  Surgeon: Onita Elspeth Sharper, DO;  Location: [facility] ENDOSCOPY;  Service: Gastroenterology;  Laterality: N/A;   NM MYOVIEW  LTD  06/2022   Lexiscan : Normal / Low Risk -> EF 45-55%. ? cannot exclude apical hypokinesis.   TRANSTHORACIC ECHOCARDIOGRAM  10/07/2021   EF 60 to 65%.  Normal LV size and function.  No RWMA.  GR 2 DD.  Moderate LA dilation.  Normal PAP.  Normal valves.  Normal RAP.    FAMILY HISTORY: Family History  Problem Relation Age of Onset   Breast cancer Sister 71    ADVANCED DIRECTIVES (Y/N):  N  HEALTH MAINTENANCE: Social History   Tobacco Use   Smoking status: Former   Smokeless tobacco: Never   Tobacco comments:    Former smoker 09/17/21  Substance Use Topics   Alcohol use: Not Currently   Drug use: Never     Colonoscopy:  PAP:  Bone density:  Lipid panel:  No Known Allergies  Current Outpatient Medications  Medication Sig Dispense Refill   acetaminophen  (TYLENOL ) 500 MG tablet Take 500-1,000 mg by mouth every 6 (six) hours as needed.     DULoxetine  (CYMBALTA ) 30 MG capsule Take 30 mg by mouth daily.     pantoprazole  (PROTONIX ) 40 MG tablet Take 40 mg by mouth daily.  12   ferrous sulfate  325 (65 FE) MG tablet Take 1 tablet (325 mg total) by  mouth daily with breakfast. (Patient not taking: Reported on 01/18/2024) 30 tablet 1   No current facility-administered medications for this visit.    OBJECTIVE: Vitals:   01/18/24 1449  BP: 102/62  Pulse: 63  Resp: 17  Temp: 98.6 F (37 C)  SpO2: 98%     Body mass index is 45.12 kg/m.    ECOG FS:0 - Asymptomatic  General: Well-developed, well-nourished, no acute distress. Eyes: Pink conjunctiva, anicteric  sclera. HEENT: Normocephalic, moist mucous membranes. Lungs: No audible wheezing or coughing. Heart: Regular rate and rhythm. Abdomen: Soft, nontender, no obvious distention. Musculoskeletal: No edema, cyanosis, or clubbing. Neuro: Alert, answering all questions appropriately. Cranial nerves grossly intact. Skin: No rashes or petechiae noted. Psych: Normal affect.  LAB RESULTS:  Lab Results  Component Value Date   NA 145 09/10/2021   K 3.9 09/10/2021   CL 111 09/10/2021   CO2 26 09/10/2021   GLUCOSE 92 09/10/2021   BUN 10 09/10/2021   CREATININE 0.77 09/10/2021   CALCIUM 8.5 (L) 09/10/2021   PROT 6.4 (L) 06/28/2021   ALBUMIN 2.9 (L) 06/28/2021   AST 12 (L) 06/28/2021   ALT 9 06/28/2021   ALKPHOS 72 06/28/2021   BILITOT 0.4 06/28/2021   GFRNONAA >60 09/10/2021   GFRAA  10/20/2007    >60        The eGFR has been calculated using the MDRD equation. This calculation has not been validated in all clinical    Lab Results  Component Value Date   WBC 10.8 08/06/2022   NEUTROABS 5.3 06/28/2021   HGB 9.9 (L) 08/06/2022   HCT 34.2 08/06/2022   MCV 72 (L) 08/06/2022   PLT 383 08/06/2022     STUDIES: No results found.  ASSESSMENT: Iron  deficiency anemia.  PLAN:    Iron  deficiency anemia: Patient's labs completed at [facility].  Her most recent hemoglobin is decreased to 10.0 with a total iron  of 17 and an iron  saturation of 4%. All of her other laboratory workup was either negative or within normal limits.  Proceed with 200 mg IV Venofer  today.  Return to clinic 4 times over the next 1 to 2 weeks for additional treatment.  Patient will then return to clinic in 4 months for further evaluation and continuation of treatment if needed.   MGUS: Patient's most recent M spike on January 12, 2024 was 0.7.  She has no evidence of endorgan damage.  No intervention is needed.  Patient does not require bone marrow biopsy.  Continue to monitor MGUS labs on a yearly basis.  I spent a  total of 30 minutes reviewing chart data, face-to-face evaluation with the patient, counseling and coordination of care as detailed above.    Patient expressed understanding and was in agreement with this plan. She also understands that She can call clinic at any time with any questions, concerns, or complaints.     Margaret JINNY Reusing, MD   01/18/2024 3:54 PM

## 2024-01-20 ENCOUNTER — Inpatient Hospital Stay

## 2024-01-20 VITALS — BP 98/64 | HR 64 | Temp 97.3°F | Resp 19

## 2024-01-20 DIAGNOSIS — D509 Iron deficiency anemia, unspecified: Secondary | ICD-10-CM | POA: Diagnosis not present

## 2024-01-20 MED ORDER — IRON SUCROSE 20 MG/ML IV SOLN
200.0000 mg | Freq: Once | INTRAVENOUS | Status: AC
  Administered 2024-01-20: 200 mg via INTRAVENOUS
  Filled 2024-01-20: qty 10

## 2024-01-20 MED ORDER — SODIUM CHLORIDE 0.9% FLUSH
10.0000 mL | Freq: Once | INTRAVENOUS | Status: AC | PRN
  Administered 2024-01-20: 10 mL
  Filled 2024-01-20: qty 10

## 2024-01-22 ENCOUNTER — Ambulatory Visit

## 2024-01-22 VITALS — BP 101/53 | HR 64 | Temp 98.1°F | Resp 18

## 2024-01-22 DIAGNOSIS — D509 Iron deficiency anemia, unspecified: Secondary | ICD-10-CM

## 2024-01-22 MED ORDER — SODIUM CHLORIDE 0.9% FLUSH
10.0000 mL | Freq: Once | INTRAVENOUS | Status: AC | PRN
  Administered 2024-01-22: 10 mL
  Filled 2024-01-22: qty 10

## 2024-01-22 MED ORDER — IRON SUCROSE 20 MG/ML IV SOLN
200.0000 mg | Freq: Once | INTRAVENOUS | Status: AC
  Administered 2024-01-22: 200 mg via INTRAVENOUS
  Filled 2024-01-22: qty 10

## 2024-01-26 ENCOUNTER — Inpatient Hospital Stay

## 2024-01-26 VITALS — BP 98/70 | HR 69 | Temp 98.7°F | Resp 16

## 2024-01-26 DIAGNOSIS — D509 Iron deficiency anemia, unspecified: Secondary | ICD-10-CM | POA: Diagnosis not present

## 2024-01-26 MED ORDER — IRON SUCROSE 20 MG/ML IV SOLN
200.0000 mg | Freq: Once | INTRAVENOUS | Status: AC
  Administered 2024-01-26: 200 mg via INTRAVENOUS
  Filled 2024-01-26: qty 10

## 2024-01-26 NOTE — Patient Instructions (Signed)

## 2024-01-28 ENCOUNTER — Inpatient Hospital Stay: Attending: Oncology

## 2024-01-28 VITALS — BP 105/57 | HR 66 | Temp 97.7°F | Resp 16

## 2024-01-28 DIAGNOSIS — D509 Iron deficiency anemia, unspecified: Secondary | ICD-10-CM | POA: Insufficient documentation

## 2024-01-28 MED ORDER — IRON SUCROSE 20 MG/ML IV SOLN
200.0000 mg | Freq: Once | INTRAVENOUS | Status: AC
  Administered 2024-01-28: 200 mg via INTRAVENOUS
  Filled 2024-01-28: qty 10

## 2024-01-28 NOTE — Patient Instructions (Signed)

## 2024-05-19 ENCOUNTER — Inpatient Hospital Stay: Attending: Oncology | Admitting: Oncology

## 2024-05-19 ENCOUNTER — Inpatient Hospital Stay

## 2024-05-20 ENCOUNTER — Encounter: Payer: Self-pay | Admitting: Oncology
